# Patient Record
Sex: Male | Born: 2000
Health system: Southern US, Community
[De-identification: ages and names within clinical notes are randomized; demographics above are authoritative.]

## PROBLEM LIST (undated history)

## (undated) DIAGNOSIS — M419 Scoliosis, unspecified: Secondary | ICD-10-CM

## (undated) DIAGNOSIS — A749 Chlamydial infection, unspecified: Secondary | ICD-10-CM

---

## 2018-02-16 ENCOUNTER — Emergency Department (HOSPITAL_COMMUNITY): Payer: No Typology Code available for payment source

## 2018-02-16 ENCOUNTER — Emergency Department (HOSPITAL_COMMUNITY)
Admission: EM | Admit: 2018-02-16 | Discharge: 2018-02-16 | Disposition: A | Discharge status: Home / Self Care | Payer: No Typology Code available for payment source | Attending: Emergency Medicine | Admitting: Emergency Medicine

## 2018-02-16 ENCOUNTER — Encounter (HOSPITAL_COMMUNITY): Payer: Self-pay | Admitting: Emergency Medicine

## 2018-02-16 ENCOUNTER — Other Ambulatory Visit: Payer: Self-pay

## 2018-02-16 DIAGNOSIS — Y939 Activity, unspecified: Secondary | ICD-10-CM | POA: Insufficient documentation

## 2018-02-16 DIAGNOSIS — S39012A Strain of muscle, fascia and tendon of lower back, initial encounter: Secondary | ICD-10-CM | POA: Diagnosis not present

## 2018-02-16 DIAGNOSIS — Y999 Unspecified external cause status: Secondary | ICD-10-CM | POA: Diagnosis not present

## 2018-02-16 DIAGNOSIS — F17210 Nicotine dependence, cigarettes, uncomplicated: Secondary | ICD-10-CM | POA: Insufficient documentation

## 2018-02-16 DIAGNOSIS — Y929 Unspecified place or not applicable: Secondary | ICD-10-CM | POA: Diagnosis not present

## 2018-02-16 DIAGNOSIS — M545 Low back pain: Secondary | ICD-10-CM | POA: Diagnosis present

## 2018-02-16 HISTORY — DX: Scoliosis, unspecified: M41.9

## 2018-02-16 LAB — ETHANOL

## 2018-02-16 LAB — I-STAT CHEM 8, ED
BUN: 11 mg/dL (ref 6–20)
CHLORIDE: 105 mmol/L (ref 98–111)
Calcium, Ion: 1.2 mmol/L (ref 1.15–1.40)
Creatinine, Ser: 0.7 mg/dL (ref 0.61–1.24)
Glucose, Bld: 86 mg/dL (ref 70–99)
HCT: 39 % (ref 39.0–52.0)
Hemoglobin: 13.3 g/dL (ref 13.0–17.0)
Potassium: 4 mmol/L (ref 3.5–5.1)
Sodium: 139 mmol/L (ref 135–145)
TCO2: 29 mmol/L (ref 22–32)

## 2018-02-16 MED ORDER — SODIUM CHLORIDE 0.9 % IV BOLUS
500.0000 mL | Freq: Once | INTRAVENOUS | Status: AC
  Administered 2018-02-16: 500 mL via INTRAVENOUS

## 2018-02-16 MED ORDER — IBUPROFEN 800 MG PO TABS
800.0000 mg | ORAL_TABLET | Freq: Once | ORAL | Status: AC
  Administered 2018-02-16: 800 mg via ORAL
  Filled 2018-02-16: qty 1

## 2018-02-16 MED ORDER — IOHEXOL 300 MG/ML  SOLN
100.0000 mL | Freq: Once | INTRAMUSCULAR | Status: AC | PRN
  Administered 2018-02-16: 100 mL via INTRAVENOUS

## 2018-02-16 MED ORDER — IBUPROFEN 800 MG PO TABS: 800.0000 mg | ORAL_TABLET | Freq: Three times a day (TID) | ORAL | 0 refills | Status: DC | PRN

## 2018-02-16 NOTE — ED Notes (Signed)
Pt verbalized understanding of d/c instructions and has no further questions, VSS, NAD.  
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
## 2018-02-16 NOTE — Discharge Instructions (Addendum)
Follow-up with your doctor if any problems 

## 2018-12-10 ENCOUNTER — Other Ambulatory Visit: Payer: Self-pay

## 2018-12-10 ENCOUNTER — Emergency Department (HOSPITAL_COMMUNITY): Payer: Self-pay

## 2018-12-10 ENCOUNTER — Emergency Department (HOSPITAL_COMMUNITY)
Admission: EM | Admit: 2018-12-10 | Discharge: 2018-12-10 | Disposition: A | Discharge status: Home / Self Care | Payer: Self-pay | Attending: Emergency Medicine | Admitting: Emergency Medicine

## 2018-12-10 ENCOUNTER — Encounter (HOSPITAL_COMMUNITY): Payer: Self-pay | Admitting: Emergency Medicine

## 2018-12-10 DIAGNOSIS — F17210 Nicotine dependence, cigarettes, uncomplicated: Secondary | ICD-10-CM | POA: Insufficient documentation

## 2018-12-10 DIAGNOSIS — R0789 Other chest pain: Secondary | ICD-10-CM | POA: Insufficient documentation

## 2018-12-10 DIAGNOSIS — F419 Anxiety disorder, unspecified: Secondary | ICD-10-CM | POA: Insufficient documentation

## 2018-12-10 LAB — CBC
HCT: 38.1 % — ABNORMAL LOW (ref 39.0–52.0)
Hemoglobin: 13.5 g/dL (ref 13.0–17.0)
MCH: 30.8 pg (ref 26.0–34.0)
MCHC: 35.4 g/dL (ref 30.0–36.0)
MCV: 86.8 fL (ref 80.0–100.0)
Platelets: 279 10*3/uL (ref 150–400)
RBC: 4.39 MIL/uL (ref 4.22–5.81)
RDW: 12.8 % (ref 11.5–15.5)
WBC: 7.6 10*3/uL (ref 4.0–10.5)
nRBC: 0 % (ref 0.0–0.2)

## 2018-12-10 LAB — TROPONIN I (HIGH SENSITIVITY)
Troponin I (High Sensitivity): 3 ng/L (ref <18)
Troponin I (High Sensitivity): 4 ng/L (ref <18)

## 2018-12-10 LAB — BASIC METABOLIC PANEL
Anion gap: 11 (ref 5–15)
BUN: 11 mg/dL (ref 6–20)
CO2: 22 mmol/L (ref 22–32)
Calcium: 9.9 mg/dL (ref 8.9–10.3)
Chloride: 103 mmol/L (ref 98–111)
Creatinine, Ser: 0.82 mg/dL (ref 0.61–1.24)
GFR calc Af Amer: >60 mL/min (ref >60)
GFR calc non Af Amer: >60 mL/min (ref >60)
Glucose, Bld: 85 mg/dL (ref 70–99)
Potassium: 3.5 mmol/L (ref 3.5–5.1)
Sodium: 136 mmol/L (ref 135–145)

## 2018-12-10 MED ORDER — SODIUM CHLORIDE 0.9% FLUSH: 3.0000 mL | Freq: Once | INTRAVENOUS | Status: DC

## 2018-12-10 MED ORDER — LORAZEPAM 1 MG PO TABS
1.0000 mg | ORAL_TABLET | Freq: Once | ORAL | Status: AC
  Administered 2018-12-10: 1 mg via ORAL
  Filled 2018-12-10: qty 1

## 2018-12-10 NOTE — ED Notes (Signed)
1 mg PO verbal order given by PA Upstill

## 2018-12-10 NOTE — ED Provider Notes (Signed)
[facility] EMERGENCY DEPARTMENT Provider Note   CSN: [id number] Arrival date & time: 12/10/18  0150     History   Chief Complaint Chief Complaint  Patient presents with  . Shortness of Breath  . Chest Pain    HPI Richard [name] is a 18 y.o. male.     Patient who is otherwise healthy presents to the ED after waking tonight while at home in bed asleep. He reports having a normal day yesterday, normal diet, no recent or current illness, no current stressful events. When he woke he states he felt "weird", then felt short of breath and had chest tightness. His symptoms lasted about an hour before then started to subside. No history of similar symptoms. He denies drug or alcohol use.   The history is provided by the patient. No language interpreter was used.  Shortness of Breath Associated symptoms: chest pain   Associated symptoms: no cough, no fever, no vomiting and no wheezing   Chest Pain Associated symptoms: shortness of breath   Associated symptoms: no cough, no fever, no nausea and no vomiting     Past Medical History:  Diagnosis Date  . Scoliosis     There are no active problems to display for this patient.   History reviewed. No pertinent surgical history.      Home Medications    Prior to Admission medications   Medication Sig Start Date End Date Taking? Authorizing Provider  ibuprofen (ADVIL,MOTRIN) 800 MG tablet Take 1 tablet (800 mg total) by mouth every 8 (eight) hours as needed for moderate pain. 02/16/18   Milton Ferguson, MD    Family History History reviewed. No pertinent family history.  Social History Social History   Tobacco Use  . Smoking status: Current Every Day Smoker    Packs/day: 0.50    Types: Cigarettes  . Smokeless tobacco: Never Used  Substance Use Topics  . Alcohol use: Never    Frequency: Never  . Drug use: Never     Allergies   Patient has no known allergies.   Review of Systems Review of Systems   Constitutional: Negative for chills and fever.  HENT: Negative.  Negative for congestion.   Respiratory: Positive for shortness of breath. Negative for cough and wheezing.   Cardiovascular: Positive for chest pain.  Gastrointestinal: Negative.  Negative for nausea and vomiting.  Musculoskeletal: Negative.   Skin: Negative.   Neurological: Negative.   Psychiatric/Behavioral: The patient is nervous/anxious.      Physical Exam Updated Vital Signs Ht 5\' 6"  (1.676 m)   Wt 58.1 kg   BMI 20.66 kg/m   Physical Exam Vitals signs and nursing note reviewed.  Constitutional:      General: He is not in acute distress.    Appearance: He is well-developed.  HENT:     Head: Normocephalic.  Neck:     Musculoskeletal: Normal range of motion and neck supple.  Cardiovascular:     Rate and Rhythm: Normal rate and regular rhythm.  Pulmonary:     Effort: Pulmonary effort is normal.     Breath sounds: Normal breath sounds. No wheezing, rhonchi or rales.  Chest:     Chest wall: No tenderness.  Abdominal:     General: Bowel sounds are normal.     Palpations: Abdomen is soft.     Tenderness: There is no abdominal tenderness. There is no guarding or rebound.  Musculoskeletal: Normal range of motion.  Skin:    General: Skin is  warm and dry.     Findings: No rash.  Neurological:     Mental Status: He is alert and oriented to person, place, and time.  Psychiatric:        Mood and Affect: Mood is anxious.        Thought Content: Thought content is not paranoid or delusional.      ED Treatments / Results  Labs (all labs ordered are listed, but only abnormal results are displayed) Labs Reviewed  CBC - Abnormal; Notable for the following components:      Result Value   HCT 38.1 (*)    All other components within normal limits  BASIC METABOLIC PANEL  TROPONIN I (HIGH SENSITIVITY)    EKG EKG Interpretation  Date/Time:  Wednesday December 10 2018 01:58:06 EDT Ventricular Rate:  73 PR  Interval:    QRS Duration: 93 QT Interval:  366 QTC Calculation: 404 R Axis:   83 Text Interpretation:  Sinus rhythm ST elev, probable normal early repol pattern When compared with ECG of 02/16/2018, No significant change was found Confirmed by Dione Booze ([id number]) on 12/10/2018 2:02:05 AM   Radiology No results found.  Procedures Procedures (including critical care time)  Medications Ordered in ED Medications  sodium chloride flush (NS) 0.9 % injection 3 mL (has no administration in time range)     Initial Impression / Assessment and Plan / ED Course  I have reviewed the triage vital signs and the nursing notes.  Pertinent labs & imaging results that were available during my care of the patient were reviewed by me and considered in my medical decision making (see chart for details).        Patient to ED after waking tonight from sleep with symptoms of SOB, chest tightness which lasted about an hour.   The patient is anxious appearing but not acutely ill. Labs, EKG, CXR are all normal. He is sleeping on re-evaluation.   Symptoms likely related to anxiety/panic. He is reassured and felt able to be discharged home.   Final Clinical Impressions(s) / ED Diagnoses   Final diagnoses:  None   1. anxiety  ED Discharge Orders    None       Danne Harbor 12/10/18 0403    Dione Booze, MD 12/10/18 [phone]
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
## 2018-12-10 NOTE — Discharge Instructions (Addendum)
Follow up with [facility] if symptoms recur.

## 2019-03-18 ENCOUNTER — Ambulatory Visit (HOSPITAL_COMMUNITY)
Admission: EM | Admit: 2019-03-18 | Discharge: 2019-03-18 | Disposition: A | Discharge status: Home / Self Care | Payer: Self-pay | Attending: Family Medicine | Admitting: Family Medicine

## 2019-03-18 ENCOUNTER — Other Ambulatory Visit: Payer: Self-pay

## 2019-03-18 ENCOUNTER — Encounter (HOSPITAL_COMMUNITY): Payer: Self-pay

## 2019-03-18 ENCOUNTER — Ambulatory Visit (INDEPENDENT_AMBULATORY_CARE_PROVIDER_SITE_OTHER): Payer: Self-pay

## 2019-03-18 ENCOUNTER — Encounter (HOSPITAL_COMMUNITY): Payer: Self-pay | Admitting: Emergency Medicine

## 2019-03-18 DIAGNOSIS — Z20822 Contact with and (suspected) exposure to covid-19: Secondary | ICD-10-CM

## 2019-03-18 DIAGNOSIS — R0602 Shortness of breath: Secondary | ICD-10-CM

## 2019-03-18 DIAGNOSIS — R110 Nausea: Secondary | ICD-10-CM

## 2019-03-18 DIAGNOSIS — M6281 Muscle weakness (generalized): Secondary | ICD-10-CM | POA: Insufficient documentation

## 2019-03-18 DIAGNOSIS — R531 Weakness: Secondary | ICD-10-CM

## 2019-03-18 DIAGNOSIS — B349 Viral infection, unspecified: Secondary | ICD-10-CM

## 2019-03-18 DIAGNOSIS — F17210 Nicotine dependence, cigarettes, uncomplicated: Secondary | ICD-10-CM | POA: Insufficient documentation

## 2019-03-18 LAB — BASIC METABOLIC PANEL
Anion gap: 10 (ref 5–15)
BUN: 10 mg/dL (ref 6–20)
CO2: 26 mmol/L (ref 22–32)
Calcium: 9.9 mg/dL (ref 8.9–10.3)
Chloride: 102 mmol/L (ref 98–111)
Creatinine, Ser: 0.79 mg/dL (ref 0.61–1.24)
GFR calc Af Amer: >60 mL/min (ref >60)
GFR calc non Af Amer: >60 mL/min (ref >60)
Glucose, Bld: 88 mg/dL (ref 70–99)
Potassium: 3.7 mmol/L (ref 3.5–5.1)
Sodium: 138 mmol/L (ref 135–145)

## 2019-03-18 LAB — CBC
HCT: 38.8 % — ABNORMAL LOW (ref 39.0–52.0)
Hemoglobin: 13.8 g/dL (ref 13.0–17.0)
MCH: 31.3 pg (ref 26.0–34.0)
MCHC: 35.6 g/dL (ref 30.0–36.0)
MCV: 88 fL (ref 80.0–100.0)
Platelets: 254 10*3/uL (ref 150–400)
RBC: 4.41 MIL/uL (ref 4.22–5.81)
RDW: 13.6 % (ref 11.5–15.5)
WBC: 7.6 10*3/uL (ref 4.0–10.5)
nRBC: 0 % (ref 0.0–0.2)

## 2019-03-18 LAB — CBG MONITORING, ED
Glucose-Capillary: 70 mg/dL (ref 70–99)
Glucose-Capillary: 70 mg/dL (ref 70–99)
Glucose-Capillary: 165 mg/dL — ABNORMAL HIGH (ref 70–99)

## 2019-03-18 LAB — GLUCOSE, CAPILLARY: Glucose-Capillary: 70 mg/dL (ref 70–99)

## 2019-03-18 MED ORDER — ONDANSETRON 4 MG PO TBDP: 4.0000 mg | ORAL_TABLET | Freq: Three times a day (TID) | ORAL | 0 refills | Status: DC | PRN

## 2019-03-18 MED ORDER — IBUPROFEN 800 MG PO TABS: 800.0000 mg | ORAL_TABLET | Freq: Three times a day (TID) | ORAL | 0 refills | Status: DC
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
## 2019-03-18 NOTE — ED Provider Notes (Signed)
[facility]   [id number] 03/18/19 Arrival Time: 7408  ASSESSMENT & PLAN:  1. Acute viral syndrome   2. Weakness   3. Nausea without vomiting   4. Exposure to COVID-19 virus     I have tried to reassure Richard [name] that I do not suspect any dangerous or life-threatening medical problems given his current symptoms. He is still very nervous and anxious. Benign exam.  See AVS for discharge instructions. Awaiting COVID testing. Work note given with self-isolation instructions.  I have personally viewed the imaging studies ordered this visit. No pneumonia on CXR. CBG WNL.  Meds ordered this encounter  Medications  . ondansetron (ZOFRAN-ODT) 4 MG disintegrating tablet    Sig: Take 1 tablet (4 mg total) by mouth every 8 (eight) hours as needed for nausea or vomiting.    Dispense:  15 tablet    Refill:  0  . ibuprofen (ADVIL) 800 MG tablet    Sig: Take 1 tablet (800 mg total) by mouth 3 (three) times daily with meals.    Dispense:  21 tablet    Refill:  0    Discussed typical duration of symptoms of viral illnesses. Ensure adequate fluid intake and rest. May f/u with PCP or here as needed.   Follow-up Information    [facility].   Specialty: Urgent Care Why: If worsening or failing to improve as anticipated. Contact information: [address] [place] [address] [phone]          Reviewed expectations re: course of current medical issues. Questions answered. Outlined signs and symptoms indicating need for more acute intervention. Patient verbalized understanding. After Visit Summary given.   SUBJECTIVE: History from: patient.  Richard [name] is a 18 y.o. male who presents with complaint of nausea without emesis, fatigue, weakness, occasional and transient SOB without CP, decreased appetite, and muscle 'tremors'. Also reports that he hasn't been able to get an erection as usual since symptoms started. Does have  nocturnal erections; 'just not right during the day'. Onset of current symptoms abrupt, approx 48 hours ago. No current SOB or wheezing. Fever: absent. Overall decreased PO intake; decreased appetite. No abdominal or back pain reported. Known sick contacts or COVID-19 exposure: yes; recently watched movie with a girl who has tested positive. No specific or significant aggravating or alleviating factors reported. He was tested for COVID yesterday; awaiting results. OTC treatment: none reported.  Received flu shot this year: no.  Social History   Tobacco Use  Smoking Status Current Every Day Smoker  . Packs/day: 0.20  . Types: Cigarettes  Smokeless Tobacco Never Used  Tobacco Comment   2 cigs per day    ROS: As per HPI. All other systems negative.    OBJECTIVE:  Vitals:   03/18/19 1024  BP: (!) 136/99  Pulse: 68  Resp: 16  Temp: 98.7 F (37.1 C)  TempSrc: Oral  SpO2: 98%     General appearance: alert; very anxious over current symptoms; muscular; appears healthy HEENT: sounds nasally congested; no nasal drainage Neck: supple without LAD CV: RRR Lungs: unlabored respirations, symmetrical air entry without wheezing; cough: absent; speaks full sentences without any difficulty Abd: soft Ext: no LE edema Back: normal Skin: warm and dry Psychological: alert and cooperative  Imaging: DG Chest 2 View  Result Date: 03/18/2019 CLINICAL DATA:  Shortness of breath EXAM: CHEST - 2 VIEW COMPARISON:  December 10, 2018 FINDINGS: Lungs are clear. Heart size and pulmonary vascularity are  normal. No adenopathy. There is again noted midthoracic dextroscoliosis. IMPRESSION: Scoliosis.  Lungs clear.  Cardiac silhouette normal.  No adenopathy. Electronically Signed   By: Lowella Grip III M.D.   On: 03/18/2019 11:04    No Known Allergies  Past Medical History:  Diagnosis Date  . Scoliosis    Family History  Problem Relation Age of Onset  . Sickle cell anemia Mother   . Healthy  Father    Social History   Socioeconomic History  . Marital status: Single    Spouse name: Not on file  . Number of children: Not on file  . Years of education: Not on file  . Highest education level: Not on file  Occupational History  . Not on file  Tobacco Use  . Smoking status: Current Every Day Smoker    Packs/day: 0.20    Types: Cigarettes  . Smokeless tobacco: Never Used  . Tobacco comment: 2 cigs per day  Substance and Sexual Activity  . Alcohol use: Never  . Drug use: Never  . Sexual activity: Not on file  Other Topics Concern  . Not on file  Social History Narrative  . Not on file   Social Determinants of Health   Financial Resource Strain:   . Difficulty of Paying Living Expenses: Not on file  Food Insecurity:   . Worried About Charity fundraiser in the Last Year: Not on file  . Ran Out of Food in the Last Year: Not on file  Transportation Needs:   . Lack of Transportation (Medical): Not on file  . Lack of Transportation (Non-Medical): Not on file  Physical Activity:   . Days of Exercise per Week: Not on file  . Minutes of Exercise per Session: Not on file  Stress:   . Feeling of Stress : Not on file  Social Connections:   . Frequency of Communication with Friends and Family: Not on file  . Frequency of Social Gatherings with Friends and Family: Not on file  . Attends Religious Services: Not on file  . Active Member of Clubs or Organizations: Not on file  . Attends Archivist Meetings: Not on file  . Marital Status: Not on file  Intimate Partner Violence:   . Fear of Current or Ex-Partner: Not on file  . Emotionally Abused: Not on file  . Physically Abused: Not on file  . Sexually Abused: Not on file           Vanessa Kick, MD 03/18/19 1206

## 2019-03-18 NOTE — Discharge Instructions (Signed)
Caring for yourself: Get plenty of rest. Drink plenty of fluids, enough so that your urine is light yellow or clear like water. If you have kidney, heart, or liver disease and have to limit fluids, talk with your doctor before you increase the amount of fluids you drink. Take an over-the-counter pain medicine if needed, such as acetaminophen (Tylenol), ibuprofen (Advil, Motrin), or naproxen (Aleve), to relieve fever, headache, and muscle aches. Read and follow all instructions on the label. No one younger than 20 should take aspirin. It has been linked to Reye syndrome, a serious illness. Before you use over the counter cough and cold medicines, check the label. These medicines may not be safe for children younger than age 6 or for people with certain health problems. If the skin around your nose and lips becomes sore, put some petroleum jelly on the area.  Avoid spreading a virus: Wash your hands regularly, and keep your hands away from your face.  Stay home from school, work, and other public places until you are feeling better and your fever has been gone for at least 24 hours. The fever needs to have gone away on its own without the help of medicine.  

## 2019-03-18 NOTE — ED Triage Notes (Signed)
Patient presents to Urgent Care with complaints of nausea, muscle weakness, erection problems, shortness of breath, loss of appetite, and tremors since two days ago. Patient reports he has had contact w/ covid positive people, had a covid test yesterday at [facility] which is pending.

## 2019-03-18 NOTE — ED Notes (Signed)
Pt states unable to void at this time, will monitor.

## 2019-03-19 ENCOUNTER — Emergency Department (HOSPITAL_COMMUNITY)
Admission: EM | Admit: 2019-03-19 | Discharge: 2019-03-19 | Disposition: A | Discharge status: Home / Self Care | Payer: Self-pay | Attending: Emergency Medicine | Admitting: Emergency Medicine

## 2019-03-19 DIAGNOSIS — R531 Weakness: Secondary | ICD-10-CM

## 2019-03-19 LAB — RAPID URINE DRUG SCREEN, HOSP PERFORMED
Amphetamines: NOT DETECTED
Barbiturates: NOT DETECTED
Benzodiazepines: NOT DETECTED
Cocaine: NOT DETECTED
Opiates: NOT DETECTED
Tetrahydrocannabinol: NOT DETECTED

## 2019-03-19 LAB — URINALYSIS, ROUTINE W REFLEX MICROSCOPIC
Bilirubin Urine: NEGATIVE
Glucose, UA: NEGATIVE mg/dL
Hgb urine dipstick: NEGATIVE
Ketones, ur: NEGATIVE mg/dL
Leukocytes,Ua: NEGATIVE
Nitrite: NEGATIVE
Protein, ur: NEGATIVE mg/dL
Specific Gravity, Urine: 1.018 (ref 1.005–1.030)
pH: 5 (ref 5.0–8.0)

## 2019-03-19 MED ORDER — SODIUM CHLORIDE 0.9 % IV BOLUS
500.0000 mL | Freq: Once | INTRAVENOUS | Status: AC
  Administered 2019-03-19: 500 mL via INTRAVENOUS

## 2019-03-19 NOTE — Discharge Instructions (Addendum)
Get plenty of rest, drink lots of fluid. Take Tylenol as needed for any fever that may develop or discomfort.   A referral has been provided for you to become established with an primary care provided, which is recommended.

## 2019-03-19 NOTE — ED Provider Notes (Signed)
[facility] DEPT Provider Note   CSN: [id number] Arrival date & time: 03/18/19  1651     History Chief Complaint  Patient presents with  . Tingling  . Weakness    Richard [name] is a 18 y.o. male.  Patient to ED with complaint of generalized weakness, fatigue, nausea, constipation for 4 days. No cough, fever, vomiting. 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 He has a COVID test pending but has not been given results yet. He feels weak when exerting - walking any distance. No lightheadedness, difficulty breathing. He feels his heart "skips a beat" periodically and his extremities have a tingling sensation intermittently. Seen at [facility] yesterday for same symptoms.   The history is provided by the patient. No language interpreter was used.  Weakness Associated symptoms: nausea and shortness of breath   Associated symptoms: no abdominal pain, no chest pain, no cough, no fever, no myalgias and no vomiting        Past Medical History:  Diagnosis Date  . Scoliosis     There are no problems to display for this patient.   History reviewed. No pertinent surgical history.     Family History  Problem Relation Age of Onset  . Sickle cell anemia Mother   . Healthy Father     Social History   Tobacco Use  . Smoking status: Current Every Day Smoker    Packs/day: 0.20    Types: Cigarettes  . Smokeless tobacco: Never Used  . Tobacco comment: 2 cigs per day  Substance Use Topics  . Alcohol use: Never  . Drug use: Never    Home Medications Prior to Admission medications   Medication Sig Start Date End Date Taking? Authorizing Provider  ibuprofen (ADVIL) 800 MG tablet Take 1 tablet (800 mg total) by mouth 3 (three) times daily with meals. 03/18/19   Vanessa Kick, MD  ondansetron (ZOFRAN-ODT) 4 MG disintegrating tablet Take 1 tablet (4 mg total) by mouth every 8 (eight) hours as needed for nausea or  vomiting. 03/18/19   Vanessa Kick, MD    Allergies    Patient has no known allergies.  Review of Systems   Review of Systems  Constitutional: Positive for activity change and fatigue. Negative for chills and fever.  HENT: Negative.  Negative for congestion and sore throat.   Respiratory: Positive for shortness of breath. Negative for cough.   Cardiovascular: Negative.  Negative for chest pain.  Gastrointestinal: Positive for constipation and nausea. Negative for abdominal pain and vomiting.  Musculoskeletal: Negative.  Negative for myalgias.  Skin: Negative.  Negative for rash.  Neurological: Positive for weakness. Negative for syncope and light-headedness.    Physical Exam Updated Vital Signs BP 122/89 (BP Location: Left Arm)   Pulse 65   Temp 99.3 F (37.4 C) (Oral)   Resp 16   SpO2 100%   Physical Exam Vitals and nursing note reviewed.  Constitutional:      Appearance: He is well-developed.  HENT:     Head: Normocephalic.  Eyes:     Conjunctiva/sclera: Conjunctivae normal.  Cardiovascular:     Rate and Rhythm: Normal rate and regular rhythm.     Heart sounds: No murmur.  Pulmonary:     Effort: Pulmonary effort is normal.     Breath sounds: Normal breath sounds. No wheezing, rhonchi or rales.  Abdominal:     General: Bowel sounds are normal.  Palpations: Abdomen is soft.     Tenderness: There is no abdominal tenderness. There is no guarding or rebound.  Musculoskeletal:        General: Normal range of motion.     Cervical back: Normal range of motion and neck supple.  Skin:    General: Skin is warm and dry.     Findings: No rash.  Neurological:     Mental Status: He is alert and oriented to person, place, and time.     ED Results / Procedures / Treatments   Labs (all labs ordered are listed, but only abnormal results are displayed) Labs Reviewed  CBC - Abnormal; Notable for the following components:      Result Value   HCT 38.8 (*)    All other  components within normal limits  CBG MONITORING, ED - Abnormal; Notable for the following components:   Glucose-Capillary 165 (*)    All other components within normal limits  BASIC METABOLIC PANEL  URINALYSIS, ROUTINE W REFLEX MICROSCOPIC    EKG None  Radiology DG Chest 2 View  Result Date: 03/18/2019 CLINICAL DATA:  Shortness of breath EXAM: CHEST - 2 VIEW COMPARISON:  December 10, 2018 FINDINGS: Lungs are clear. Heart size and pulmonary vascularity are normal. No adenopathy. There is again noted midthoracic dextroscoliosis. IMPRESSION: Scoliosis.  Lungs clear.  Cardiac silhouette normal.  No adenopathy. Electronically Signed   By: Bretta Bang III M.D.   On: 03/18/2019 11:04    Procedures Procedures (including critical care time)  Medications Ordered in ED Medications - No data to display  ED Course  I have reviewed the triage vital signs and the nursing notes.  Pertinent labs & imaging results that were available during my care of the patient were reviewed by me and considered in my medical decision making (see chart for details).    MDM Rules/Calculators/A&P                      Patient to ED with symptoms as described in HPI.   He is well appearing. Non-toxic, normal VS. Chart review shows normal CXR from previous evaluation at Urgent Care. Labs here (CBC, BMET) without abnormality. He has so far refused to provide urine.   Will given small IVF bolus. Will wait for UA. Will add UDS.   UA, UDS negative. VSS. He is sleeping on re-evaluation. Well appearing. Unclear as to what is the cause of symptoms, however, does not appear to be a bacterial infection, sepsis, metabolic abnormality. He has received fluids and reports feeling some better. COVID pending through the [facility].   He can be discharged home. Encouraged to establish with primary care provider. Referral provided.   Final Clinical Impression(s) / ED Diagnoses Final diagnoses:  None   1.  Generalized weakness  Rx / DC Orders ED Discharge Orders    None       Elpidio Anis, PA-C 03/19/19 0304    Mesner, Barbara Cower, MD 03/19/19 [phone]

## 2019-03-19 NOTE — ED Notes (Signed)
Pt given urinal and made aware of need for urine specimen 

## 2019-03-20 ENCOUNTER — Emergency Department (HOSPITAL_COMMUNITY): Payer: Self-pay

## 2019-03-20 ENCOUNTER — Observation Stay (HOSPITAL_COMMUNITY): Payer: Self-pay

## 2019-03-20 ENCOUNTER — Encounter (HOSPITAL_COMMUNITY): Payer: Self-pay | Admitting: Primary Care

## 2019-03-20 ENCOUNTER — Inpatient Hospital Stay (HOSPITAL_COMMUNITY)
Admission: EM | Admit: 2019-03-20 | Discharge: 2019-03-22 | DRG: 101 | Disposition: A | Discharge status: Home / Self Care | Payer: Self-pay | Attending: Internal Medicine | Admitting: Internal Medicine

## 2019-03-20 DIAGNOSIS — N529 Male erectile dysfunction, unspecified: Secondary | ICD-10-CM | POA: Diagnosis present

## 2019-03-20 DIAGNOSIS — Z20822 Contact with and (suspected) exposure to covid-19: Secondary | ICD-10-CM | POA: Diagnosis present

## 2019-03-20 DIAGNOSIS — R001 Bradycardia, unspecified: Secondary | ICD-10-CM | POA: Diagnosis present

## 2019-03-20 DIAGNOSIS — Z832 Family history of diseases of the blood and blood-forming organs and certain disorders involving the immune mechanism: Secondary | ICD-10-CM

## 2019-03-20 DIAGNOSIS — M419 Scoliosis, unspecified: Secondary | ICD-10-CM | POA: Diagnosis present

## 2019-03-20 DIAGNOSIS — F1290 Cannabis use, unspecified, uncomplicated: Secondary | ICD-10-CM | POA: Diagnosis present

## 2019-03-20 DIAGNOSIS — K5900 Constipation, unspecified: Secondary | ICD-10-CM | POA: Diagnosis present

## 2019-03-20 DIAGNOSIS — Y990 Civilian activity done for income or pay: Secondary | ICD-10-CM

## 2019-03-20 DIAGNOSIS — R569 Unspecified convulsions: Principal | ICD-10-CM

## 2019-03-20 DIAGNOSIS — E860 Dehydration: Secondary | ICD-10-CM | POA: Diagnosis present

## 2019-03-20 DIAGNOSIS — F17210 Nicotine dependence, cigarettes, uncomplicated: Secondary | ICD-10-CM | POA: Diagnosis present

## 2019-03-20 DIAGNOSIS — F419 Anxiety disorder, unspecified: Secondary | ICD-10-CM

## 2019-03-20 DIAGNOSIS — Z823 Family history of stroke: Secondary | ICD-10-CM

## 2019-03-20 DIAGNOSIS — Y9289 Other specified places as the place of occurrence of the external cause: Secondary | ICD-10-CM

## 2019-03-20 DIAGNOSIS — F410 Panic disorder [episodic paroxysmal anxiety] without agoraphobia: Secondary | ICD-10-CM | POA: Diagnosis present

## 2019-03-20 DIAGNOSIS — W1830XA Fall on same level, unspecified, initial encounter: Secondary | ICD-10-CM | POA: Diagnosis present

## 2019-03-20 DIAGNOSIS — D573 Sickle-cell trait: Secondary | ICD-10-CM | POA: Diagnosis present

## 2019-03-20 HISTORY — DX: Unspecified convulsions: R56.9

## 2019-03-20 LAB — CK: Total CK: 752 U/L — ABNORMAL HIGH (ref 49–397)

## 2019-03-20 LAB — CSF CELL COUNT WITH DIFFERENTIAL
RBC Count, CSF: 11 /mm3 — ABNORMAL HIGH
RBC Count, CSF: 2 /mm3 — ABNORMAL HIGH
Tube #: 4
Tube #: 1
WBC, CSF: 0 /mm3 (ref 0–5)
WBC, CSF: 0 /mm3 (ref 0–5)

## 2019-03-20 LAB — CBC WITH DIFFERENTIAL/PLATELET
Abs Immature Granulocytes: 0.01 10*3/uL (ref 0.00–0.07)
Basophils Absolute: 0 10*3/uL (ref 0.0–0.1)
Basophils Relative: 1 %
Eosinophils Absolute: 0 10*3/uL (ref 0.0–0.5)
Eosinophils Relative: 1 %
HCT: 38.4 % — ABNORMAL LOW (ref 39.0–52.0)
Hemoglobin: 13.4 g/dL (ref 13.0–17.0)
Immature Granulocytes: 0 %
Lymphocytes Relative: 18 %
Lymphs Abs: 1.1 10*3/uL (ref 0.7–4.0)
MCH: 31.1 pg (ref 26.0–34.0)
MCHC: 34.9 g/dL (ref 30.0–36.0)
MCV: 89.1 fL (ref 80.0–100.0)
Monocytes Absolute: 0.5 10*3/uL (ref 0.1–1.0)
Monocytes Relative: 8 %
Neutro Abs: 4.2 10*3/uL (ref 1.7–7.7)
Neutrophils Relative %: 72 %
Platelets: 284 10*3/uL (ref 150–400)
RBC: 4.31 MIL/uL (ref 4.22–5.81)
RDW: 13.6 % (ref 11.5–15.5)
WBC: 5.8 10*3/uL (ref 4.0–10.5)
nRBC: 0 % (ref 0.0–0.2)

## 2019-03-20 LAB — COMPREHENSIVE METABOLIC PANEL
ALT: 40 U/L (ref 0–44)
AST: 28 U/L (ref 15–41)
Albumin: 4.6 g/dL (ref 3.5–5.0)
Alkaline Phosphatase: 61 U/L (ref 38–126)
Anion gap: 12 (ref 5–15)
BUN: 8 mg/dL (ref 6–20)
CO2: 24 mmol/L (ref 22–32)
Calcium: 9.9 mg/dL (ref 8.9–10.3)
Chloride: 103 mmol/L (ref 98–111)
Creatinine, Ser: 0.96 mg/dL (ref 0.61–1.24)
GFR calc Af Amer: >60 mL/min (ref >60)
GFR calc non Af Amer: >60 mL/min (ref >60)
Glucose, Bld: 89 mg/dL (ref 70–99)
Potassium: 4.2 mmol/L (ref 3.5–5.1)
Sodium: 139 mmol/L (ref 135–145)
Total Bilirubin: 1.5 mg/dL — ABNORMAL HIGH (ref 0.3–1.2)
Total Protein: 7.4 g/dL (ref 6.5–8.1)

## 2019-03-20 LAB — RESPIRATORY PANEL BY RT PCR (FLU A&B, COVID)
Influenza A by PCR: NEGATIVE
Influenza B by PCR: NEGATIVE
SARS Coronavirus 2 by RT PCR: NEGATIVE

## 2019-03-20 LAB — SEDIMENTATION RATE: Sed Rate: 2 mm/hr (ref 0–16)

## 2019-03-20 LAB — ETHANOL: Alcohol, Ethyl (B): <10 mg/dL (ref <10)

## 2019-03-20 LAB — CRYPTOCOCCAL ANTIGEN, CSF: Crypto Ag: NEGATIVE

## 2019-03-20 LAB — GLUCOSE, CSF: Glucose, CSF: 55 mg/dL (ref 40–70)

## 2019-03-20 LAB — PROTEIN, CSF: Total  Protein, CSF: 21 mg/dL (ref 15–45)

## 2019-03-20 LAB — SARS CORONAVIRUS 2 (TAT 6-24 HRS): SARS Coronavirus 2: NEGATIVE

## 2019-03-20 LAB — TSH: TSH: 0.548 u[IU]/mL (ref 0.350–4.500)

## 2019-03-20 MED ORDER — ONDANSETRON HCL 4 MG/2ML IJ SOLN: 4.0000 mg | Freq: Four times a day (QID) | INTRAMUSCULAR | Status: DC | PRN

## 2019-03-20 MED ORDER — LORAZEPAM 2 MG/ML IJ SOLN
INTRAMUSCULAR | Status: AC
  Administered 2019-03-20: 10:00:00 2 mg
  Filled 2019-03-20: qty 1

## 2019-03-20 MED ORDER — SENNOSIDES-DOCUSATE SODIUM 8.6-50 MG PO TABS
2.0000 | ORAL_TABLET | Freq: Two times a day (BID) | ORAL | Status: DC
  Administered 2019-03-20 – 2019-03-21 (×2): 2 via ORAL
  Filled 2019-03-20 (×4): qty 2

## 2019-03-20 MED ORDER — LORAZEPAM 2 MG/ML IJ SOLN
INTRAMUSCULAR | Status: AC
  Filled 2019-03-20: qty 1

## 2019-03-20 MED ORDER — GADOBUTROL 1 MMOL/ML IV SOLN
5.5000 mL | Freq: Once | INTRAVENOUS | Status: AC | PRN
  Administered 2019-03-20: 19:00:00 5.5 mL via INTRAVENOUS

## 2019-03-20 MED ORDER — DEXTROSE 5 % IV SOLN
10.0000 mg/kg | Freq: Three times a day (TID) | INTRAVENOUS | Status: DC
  Administered 2019-03-21 (×2): 580 mg via INTRAVENOUS
  Filled 2019-03-20 (×5): qty 11.6

## 2019-03-20 MED ORDER — LORAZEPAM 2 MG/ML IJ SOLN
2.0000 mg | Freq: Four times a day (QID) | INTRAMUSCULAR | Status: DC | PRN
  Filled 2019-03-20 (×2): qty 1

## 2019-03-20 MED ORDER — ACETAMINOPHEN 325 MG PO TABS: 650.0000 mg | ORAL_TABLET | Freq: Four times a day (QID) | ORAL | Status: DC | PRN

## 2019-03-20 NOTE — Consult Note (Addendum)
NEURO HOSPITALIST CONSULT NOTE   Requesting physician: Dr. Anitra Lauth   Reason for Consult: witnessed seizure like activity   History obtained from:  Patient / mother  HPI:                                                                                                                                          Richard [name] is an 19 y.o. male  whith PMH scoliosis and sickle cell trait who presented to [facility] ED after a witnessed seizure like episode at his place of work which is at [employer] in the [facility].  Patient was seen  And discharged yesterday from  [facility]- ED for numbess/tingling , weakness to both legs and constipation for 4 days. Patient has been having weakness of both legs for about 1 week. Today, while at work (works in [employer]) patient was in the breakroom and  Started having generalized shaking, staff caught him before he fell to the floor. Patient stated that today before the seizure like episode he felt weak, had some chest tightness. He felt like tingling shooting through his entire body and through his head. Denies any sick contact, fever, chills,n/v/d, SOB, CP. Endorses sensation of lightheaded. Denies ETOH, or drug abuse. Endorses smoking. Mother at the bedside and denies any prior seizure history including febrile seizures. Mother stated that she had sickle cell and has had a stroke and subsequent seizures.   Additional information: Possibly confided in some friend that he is having some troubles with the home front and has been homeless as he is now living with his parents anymore.  He has been picking up extra work shifts so as to be able to stay indoors.  Denied any anxiety or depression at this time.   Past Medical History:  Diagnosis Date  . Scoliosis     No past surgical history on file.  Family History  Problem Relation Age of Onset  . Sickle cell anemia Mother   . CVA Mother        CASUSED BY SICKLE CELL   . Seizures  Mother        after cva  . Healthy Father        Social History:  reports that he has been smoking cigarettes. He has been smoking about 0.20 packs per day. He has never used smokeless tobacco. He reports that he does not drink alcohol or use drugs.  No Known Allergies  MEDICATIONS:  Current Facility-Administered Medications  Medication Dose Route Frequency Provider Last Rate Last Admin  . LORazepam (ATIVAN) 2 MG/ML injection            Current Outpatient Medications  Medication Sig Dispense Refill  . ibuprofen (ADVIL) 800 MG tablet Take 1 tablet (800 mg total) by mouth 3 (three) times daily with meals. (Patient not taking: Reported on 03/20/2019) 21 tablet 0  . ondansetron (ZOFRAN-ODT) 4 MG disintegrating tablet Take 1 tablet (4 mg total) by mouth every 8 (eight) hours as needed for nausea or vomiting. (Patient not taking: Reported on 03/20/2019) 15 tablet 0     ROS:                                                                                                                                       ROS was performed and is negative except as noted in HPI  Blood pressure 121/73, pulse (!) 51, temperature (!) 97.4 F (36.3 C), temperature source Axillary, resp. rate (!) 22, SpO2 100 %. General Examination:                                                                                                       Physical Exam  Constitutional: Appears well-developed and well-nourished.  Psych: Affect appropriate to situation Eyes: Normal external eye and conjunctiva. HENT: Normocephalic, no lesions, without obvious abnormality.   Musculoskeletal-no joint tenderness, deformity or swelling Cardiovascular: Normal rate and regular rhythm.  Respiratory: Effort normal, non-labored breathing saturations WNL GI: Soft.  No distension. There is no tenderness.  Skin: WDI Neurological  Examination Mental Status: Drowsy, oriented, thought content appropriate.  Speech fluent and slow without evidence of aphasia.  Able to follow commands without difficulty. Cranial Nerves: PP:JKDTOI fields grossly normal,  III,IV, VI: ptosis not present, extra-ocular motions intact bilaterally pupils equal, round, reactive to light and accommodation V,VII: smile symmetric, facial light touch sensation normal bilaterally VIII: hearing normal bilaterally IX,X: uvula rises symmetrically XI: bilateral shoulder shrug XII: midline tongue extension Motor: Able to lift all 4 extremities anti gravity Tone and bulk:normal tone throughout; no atrophy noted Sensory:light touch intact throughout, bilaterally Deep Tendon Reflexes: 2+ brisk and symmetric throughout Cerebellar: No ataxia Gait: deferred   Lab Results: Basic Metabolic Panel: Recent Labs  Lab 03/18/19 1746 03/20/19 1016  NA 138 139  K 3.7 4.2  CL 102 103  CO2 26 24  GLUCOSE 88 89  BUN 10 8  CREATININE 0.79 0.96  CALCIUM 9.9 9.9  CBC: Recent Labs  Lab 03/18/19 1746 03/20/19 1016  WBC 7.6 5.8  NEUTROABS  --  4.2  HGB 13.8 13.4  HCT 38.8* 38.4*  MCV 88.0 89.1  PLT 254 284    Imaging: CT Head Wo Contrast  Result Date: 03/20/2019 CLINICAL DATA:  Seizure. Weakness and tingling in left leg for 3 days. EXAM: CT HEAD WITHOUT CONTRAST TECHNIQUE: Contiguous axial images were obtained from the base of the skull through the vertex without intravenous contrast. COMPARISON:  02/16/2018. FINDINGS: Brain: No mass lesion, hemorrhage, hydrocephalus, acute infarct, intra-axial, or extra-axial fluid collection. Vascular: No hyperdense vessel or unexpected calcification. Skull: No significant soft tissue swelling.  No skull fracture. Sinuses/Orbits: Normal imaged portions of the orbits and globes. Clear paranasal sinuses and mastoid air cells. Other: None. IMPRESSION: Normal head CT. Electronically Signed   By: Abigail Miyamoto M.D.   On:  03/20/2019 11:32   Assessment: Richard [name] is an 19 y.o. male  Who presented to [facility] ED after a seizure like episode along with prolonged postictal state. Patient has been feeling generally tired for about a week.  With bilateral leg weakness. Patient has  Been afebrile and was seen in [facility] ED yesterday for numbness/tingling/ generalized weakness. On exam no neck stiffness, but will obtain LP to r/o encephalitis given the viral prodrome. Will obtain MRI and EEG. Seizure was witnessed by the witnessing people are not available to describe the seizure-no reason for me to suspect a nonepileptic event but some preceding information that he shared with some friends about being stressed/homeless-would make me keep psychogenic nonepileptic etiology lower on the differential if all the other work-up remains unremarkable.  Recommendations: --MRI brain --EEG --LP --Acyclovir coverage for HSV encephalitis till the HSV PCR results come back. --No antiepileptics for now.  Low threshold to start antiepileptics. -- Laurey Morale, MSN, NP-C [facility] [phone]  Attending neurologist's note to follow  Attending Neurohospitalist Addendum Patient seen and examined with APP/Resident. Agree with the history and physical as documented above. Agree with the plan as documented, which I helped formulate. I have independently reviewed the chart, obtained history, review of systems and examined the patient.I have personally reviewed pertinent head/neck/spine imaging (CT/MRI). Please feel free to call with any questions. --- Amie Portland, MD [facility] Pager: [phone] If 7pm to 7am, please call on call as listed on AMION.

## 2019-03-20 NOTE — Procedures (Addendum)
Patient Name: Richard [name]  MRN: [id number]  Epilepsy Attending: Charlsie Quest  Referring Physician/Provider: Valentina Lucks, NP Date: 03/20/2019 Duration: 23.30 mins  Patient history: [name] is an 19 y.o. male  Who presented to [facility] ED after a seizure like episode along with prolonged postictal state. EEG to evaluate for seizure  Level of alertness: awake and asleep  AEDs during EEG study: ativan  Technical aspects: This EEG study was done with scalp electrodes positioned according to the 10-20 International system of electrode placement. Electrical activity was acquired at a sampling rate of 500Hz  and reviewed with a high frequency filter of 70Hz  and a low frequency filter of 1Hz . EEG data were recorded continuously and digitally stored.   DESCRIPTION: The posterior dominant rhythm consists of 10 Hz activity of moderate voltage (25-35 uV) seen predominantly in posterior head regions, symmetric and reactive to eye opening and eye closing. Sleep was characterized by vertex waves, sleep spindles (12-14hz ), maximal frontocentral. No EEG change was seen during hyperventilation. Physiologic photic driving was seen during photic stimulation.     IMPRESSION: This study is within normal limits. No seizures or epileptiform discharges were seen throughout the recording.  Azucena Dart 

## 2019-03-20 NOTE — ED Provider Notes (Signed)
[name] EMERGENCY DEPARTMENT Provider Note   CSN: [id number] Arrival date & time: 03/20/19  1004     History Chief Complaint  Patient presents with  . Seizures    Richard [name] is a 19 y.o. male.  Patient is a 19 year old male with no known significant medical history presenting today with rapid response after having a seizure while in the break room.  Patient started having generalized shaking but staff caught him prior to him falling to the floor.  Patient was seen yesterday in the emergency room and urgent care for generalized malaise, nausea and just not feeling well.  Per their notes this had been going on for 4 days.  Patient had blood work with CBC, BMP, chest x-ray, UDS and UA done yesterday all which showed no acute findings.  Patient was given a small bolus of fluid and per emergency room note was feeling better prior to discharge.  Patient currently is not awake or alert or able to give any history.  Staff reported he had been complaining of a headache and may be some dental pain over the last few days but today he suddenly became unresponsive.  Upon arrival to the emergency room patient is still not responding.  He is noted to have generalized shaking intermittently which seems to be more present in the right arm and leg.  The history is provided by medical records (rapid response). The history is limited by the condition of the patient.  Seizures Seizure activity on arrival: yes   Seizure type:  Grand mal      Past Medical History:  Diagnosis Date  . Scoliosis     There are no problems to display for this patient.   No past surgical history on file.     Family History  Problem Relation Age of Onset  . Sickle cell anemia Mother   . Healthy Father     Social History   Tobacco Use  . Smoking status: Current Every Day Smoker    Packs/day: 0.20    Types: Cigarettes  . Smokeless tobacco: Never Used  . Tobacco comment: 2 cigs per day    Substance Use Topics  . Alcohol use: Never  . Drug use: Never    Home Medications Prior to Admission medications   Medication Sig Start Date End Date Taking? Authorizing Provider  ibuprofen (ADVIL) 800 MG tablet Take 1 tablet (800 mg total) by mouth 3 (three) times daily with meals. 03/18/19   Mardella Layman, MD  ondansetron (ZOFRAN-ODT) 4 MG disintegrating tablet Take 1 tablet (4 mg total) by mouth every 8 (eight) hours as needed for nausea or vomiting. 03/18/19   Mardella Layman, MD    Allergies    Patient has no known allergies.  Review of Systems   Review of Systems  Unable to perform ROS: Mental status change  Neurological: Positive for seizures.    Physical Exam Updated Vital Signs BP 118/74   Pulse 70   Temp (!) 97.4 F (36.3 C) (Axillary)   Resp 17   SpO2 100%   Physical Exam Vitals and nursing note reviewed.  Constitutional:      General: He is not in acute distress.    Appearance: He is well-developed and normal weight.     Comments: Patient is actively seizing and not responding  HENT:     Head: Normocephalic and atraumatic.     Mouth/Throat:     Mouth: Mucous membranes are moist.     Comments:  Currently clenched down and unable to evaluate the tongue Eyes:     Conjunctiva/sclera: Conjunctivae normal.     Pupils: Pupils are equal, round, and reactive to light.  Cardiovascular:     Rate and Rhythm: Normal rate and regular rhythm.     Heart sounds: No murmur.  Pulmonary:     Effort: Pulmonary effort is normal. No respiratory distress.     Breath sounds: Normal breath sounds. No wheezing or rales.  Abdominal:     General: There is no distension.     Palpations: Abdomen is soft.     Tenderness: There is no abdominal tenderness. There is no guarding or rebound.  Musculoskeletal:        General: No tenderness. Normal range of motion.     Cervical back: Normal range of motion and neck supple.  Skin:    General: Skin is warm and dry.     Capillary Refill:  Capillary refill takes less than 2 seconds.     Findings: No erythema or rash.  Neurological:     Comments: Actively seizing on arrival.  More notable tonic-clonic movements of the right arm and leg.  This activity is intermittent.  Occasional eye blinking but no eye deviation.  Psychiatric:     Comments: Unresponsive      ED Results / Procedures / Treatments   Labs (all labs ordered are listed, but only abnormal results are displayed) Labs Reviewed  CBC WITH DIFFERENTIAL/PLATELET - Abnormal; Notable for the following components:      Result Value   HCT 38.4 (*)    All other components within normal limits  COMPREHENSIVE METABOLIC PANEL - Abnormal; Notable for the following components:   Total Bilirubin 1.5 (*)    All other components within normal limits  RESPIRATORY PANEL BY RT PCR (FLU A&B, COVID)  ETHANOL  RAPID URINE DRUG SCREEN, HOSP PERFORMED    EKG EKG Interpretation  Date/Time:  Friday March 20 2019 10:11:50 EST Ventricular Rate:  62 PR Interval:    QRS Duration: 80 QT Interval:  380 QTC Calculation: 386 R Axis:   81 Text Interpretation: Sinus rhythm ST elev, probable normal early repol pattern No significant change since last tracing Confirmed by Gwyneth Sprout ([id number]) on 03/20/2019 10:31:37 AM   Radiology CT Head Wo Contrast  Result Date: 03/20/2019 CLINICAL DATA:  Seizure. Weakness and tingling in left leg for 3 days. EXAM: CT HEAD WITHOUT CONTRAST TECHNIQUE: Contiguous axial images were obtained from the base of the skull through the vertex without intravenous contrast. COMPARISON:  02/16/2018. FINDINGS: Brain: No mass lesion, hemorrhage, hydrocephalus, acute infarct, intra-axial, or extra-axial fluid collection. Vascular: No hyperdense vessel or unexpected calcification. Skull: No significant soft tissue swelling.  No skull fracture. Sinuses/Orbits: Normal imaged portions of the orbits and globes. Clear paranasal sinuses and mastoid air cells. Other: None.  IMPRESSION: Normal head CT. Electronically Signed   By: Jeronimo Greaves M.D.   On: 03/20/2019 11:32    Procedures .Lumbar Puncture  Date/Time: 03/20/2019 3:26 PM Performed by: Gwyneth Sprout, MD Authorized by: Gwyneth Sprout, MD   Consent:    Consent obtained:  Verbal   Consent given by:  Patient   Risks discussed:  Bleeding, infection, headache and pain   Alternatives discussed:  No treatment Pre-procedure details:    Procedure purpose:  Diagnostic   Preparation: Patient was prepped and draped in usual sterile fashion   Anesthesia (see MAR for exact dosages):    Anesthesia method:  Local infiltration  Local anesthetic:  Lidocaine 2% WITH epi Procedure details:    Lumbar space:  L4-L5 interspace   Patient position:  Sitting   Needle gauge:  22   Needle type:  Diamond point   Needle length (in):  1.5   Ultrasound guidance: no     Number of attempts:  1   Fluid appearance:  Clear   Tubes of fluid:  4   Total volume (ml):  5 Post-procedure:    Puncture site:  Adhesive bandage applied   Patient tolerance of procedure:  Tolerated well, no immediate complications   (including critical care time)  Medications Ordered in ED Medications  LORazepam (ATIVAN) 2 MG/ML injection (has no administration in time range)  LORazepam (ATIVAN) 2 MG/ML injection (2 mg  Given 03/20/19 1006)    ED Course  I have reviewed the triage vital signs and the nursing notes.  Pertinent labs & imaging results that were available during my care of the patient were reviewed by me and considered in my medical decision making (see chart for details).    MDM Rules/Calculators/A&P                     19 year old male presenting today with new onset seizure.  Mom reports that patient has never had seizures before.  Seizure activity was generalized but then became more localized to the right arm and leg.  Patient did receive 2 mg of Ativan and then appeared to stop seizing.  He would open his eyes to voice  but we will continue to monitor.  Patient had been seen recently in the last day for general malaise, nausea and not feeling himself.  Patient did have normal labs yesterday and chest x-ray.  Unclear source of seizure at this time.  Concern for infectious etiology versus Covid versus brain abnormality.  Head CT is pending and rapid Covid is pending.  We will continue to monitor closely.  Drug screen was negative yesterday.  12:58 PM Patient is still very groggy and not back to baseline.  Head CT is negative, labs are reassuring.  Speaking with a coworker patient over the last few weeks to a month complains of regular headaches, feeling generally drained and tired.  Patient has been working a lot a double shifts and has confided in coworker that he is homeless so he is currently staying at [employer] or in the park.  Coworker states there is a lot going on and he cannot be at home.  Patient's mother is at bedside currently and states she has a history of seizures and does take medication for them but he has never had febrile seizures or any other type of seizure.  He takes no prescription medications and is healthy as far she knows.  Coworker denies any history of him stating that he wants to hurt himself but she states he does deal with a lot of anger even to the point where it might threaten his job.  But he has never confided in her about using any type of substances. Given patient's ongoing abnormal mental status, prolonged seizure today for the first time.  Will have neurology come and evaluate.  2:36 PM Patient is more awake now on exam.  Neurology request that an LP be done just to ensure there is no evidence for encephalitis.  Also they recommend an MRI and a 24-hour EEG.  Patient consents for an LP and fluid is sent.  CRITICAL CARE Performed by: Alayia Meggison Total  critical care time: 30 minutes Critical care time was exclusive of separately billable procedures and treating other  patients. Critical care was necessary to treat or prevent imminent or life-threatening deterioration. Critical care was time spent personally by me on the following activities: development of treatment plan with patient and/or surrogate as well as nursing, discussions with consultants, evaluation of patient's response to treatment, examination of patient, obtaining history from patient or surrogate, ordering and performing treatments and interventions, ordering and review of laboratory studies, ordering and review of radiographic studies, pulse oximetry and re-evaluation of patient's condition.   Final Clinical Impression(s) / ED Diagnoses Final diagnoses:  Seizure-like activity [facility])    Rx / DC Orders ED Discharge Orders    None       Gwyneth Sprout, MD 03/20/19 1527

## 2019-03-20 NOTE — H&P (Addendum)
History and Physical  Richard [name] TKZ:[id number] DOB: [birth year] DOA: 03/20/2019  Referring physician: Dr. Maryan Rued PCP: Patient, No Pcp Per  Outpatient Specialists: None Patient coming from: Work, [employer] [facility] [place] Complaint: Seizure like activity at work  HPI: Richard [name] is a 19 y.o. [name] with no significant past medical history on no prescribed medication who was brought in to the ED at [facility] from work at [employer] due to seizure like activity.  After arriving to the ED he had another seizure like activity for which he was given IV Ativan push.  Neurology consulted by EDP, Dr. Rory Percy, requested MRI, EEG and LP.  CT head non acute and unremarkable.  EEG showed no evidence of seizure or epileptiform activity.  LP completed.  Neurology recommended starting Acyclovir until HSV encephalitis is ruled out.   Also recommended to hold off AEDs for now.  Patient is able to answer questions.  Had a prior episode of shaking uncontrollably in September 2020, was aware of it but could not stop himself.  Reports bilateral lower extremity weakness associated with tingling for 6 days and similar symptoms in his upper extremities since Sunday, 6 days ago after eating a burger that evening.  He is concerned about inability to have an erection for the past 2 days.  In a monogamous heterosexual relationship.  No urinary or bowel incontinence.  +constipation with hard stools.  Smoked marijuana 4 days ago provided by same supplier.  No use of alcohol or other illicit drugs.  [facility] asked to admit.  ED Course: UDS and alcohol level undetectable. EKG in sinus rhythm without specific ST T changes. Vital signs stable. Labs studies unremarkable except for mildly elevated T. Bilirubin.   Review of Systems: Review of systems as noted in the HPI. All other systems reviewed and are negative.   Past Medical History:  Diagnosis Date  . Scoliosis   . Seizure ([place]) 03/20/2019   No past surgical  history on file.  Social History:  reports that he has been smoking cigarettes. He has been smoking about 0.20 packs per day. He has never used smokeless tobacco. He reports that he does not drink alcohol or use drugs.   No Known Allergies  Family History  Problem Relation Age of Onset  . Sickle cell anemia Mother   . CVA Mother        CASUSED BY SICKLE CELL   . Seizures Mother        after cva  . Healthy Father      Prior to Admission medications   Medication Sig Start Date End Date Taking? Authorizing Provider  ibuprofen (ADVIL) 800 MG tablet Take 1 tablet (800 mg total) by mouth 3 (three) times daily with meals. Patient not taking: Reported on 03/20/2019 03/18/19   Vanessa Kick, MD  ondansetron (ZOFRAN-ODT) 4 MG disintegrating tablet Take 1 tablet (4 mg total) by mouth every 8 (eight) hours as needed for nausea or vomiting. Patient not taking: Reported on 03/20/2019 03/18/19   Vanessa Kick, MD    Physical Exam: BP (!) 115/94   Pulse (!) 51   Temp (!) 97.4 F (36.3 C) (Axillary)   Resp 18   Ht '5\' 6"'  (1.676 m)   Wt 58.1 kg   SpO2 100%   BMI 20.66 kg/m   . General: 19 y.o. year-old [name] well developed well nourished in no acute distress.  Alert and oriented x3. . Cardiovascular: Regular rate and rhythm with no rubs  or gallops.  No thyromegaly or JVD noted.  No lower extremity edema. 2/4 pulses in all 4 extremities. Marland Kitchen Respiratory: Clear to auscultation with no wheezes or rales. Good inspiratory effort. . Abdomen: Soft nontender nondistended with normal bowel sounds x4 quadrants. . Muskuloskeletal: No cyanosis, clubbing or edema noted bilaterally . Neuro: CN II-XII intact, strength, sensation, reflexes . Skin: No ulcerative lesions noted or rashes . Psychiatry: Judgement and insight appear normal. Mood is appropriate for condition and setting          Labs on Admission:  Basic Metabolic Panel: Recent Labs  Lab 03/18/19 1746 03/20/19 1016  NA 138 139  K 3.7 4.2  CL  102 103  CO2 26 24  GLUCOSE 88 89  BUN 10 8  CREATININE 0.79 0.96  CALCIUM 9.9 9.9   Liver Function Tests: Recent Labs  Lab 03/20/19 1016  AST 28  ALT 40  ALKPHOS 61  BILITOT 1.5*  PROT 7.4  ALBUMIN 4.6   No results for input(s): LIPASE, AMYLASE in the last 168 hours. No results for input(s): AMMONIA in the last 168 hours. CBC: Recent Labs  Lab 03/18/19 1746 03/20/19 1016  WBC 7.6 5.8  NEUTROABS  --  4.2  HGB 13.8 13.4  HCT 38.8* 38.4*  MCV 88.0 89.1  PLT 254 284   Cardiac Enzymes: No results for input(s): CKTOTAL, CKMB, CKMBINDEX, TROPONINI in the last 168 hours.  BNP (last 3 results) No results for input(s): BNP in the last 8760 hours.  ProBNP (last 3 results) No results for input(s): PROBNP in the last 8760 hours.  CBG: Recent Labs  Lab 03/18/19 1040 03/18/19 2110  GLUCAP 70  70  70 165*    Radiological Exams on Admission: CT Head Wo Contrast  Result Date: 03/20/2019 CLINICAL DATA:  Seizure. Weakness and tingling in left leg for 3 days. EXAM: CT HEAD WITHOUT CONTRAST TECHNIQUE: Contiguous axial images were obtained from the base of the skull through the vertex without intravenous contrast. COMPARISON:  02/16/2018. FINDINGS: Brain: No mass lesion, hemorrhage, hydrocephalus, acute infarct, intra-axial, or extra-axial fluid collection. Vascular: No hyperdense vessel or unexpected calcification. Skull: No significant soft tissue swelling.  No skull fracture. Sinuses/Orbits: Normal imaged portions of the orbits and globes. Clear paranasal sinuses and mastoid air cells. Other: None. IMPRESSION: Normal head CT. Electronically Signed   By: Abigail Miyamoto M.D.   On: 03/20/2019 11:32   EEG adult  Result Date: 03/20/2019 Lora Havens, MD     03/20/2019  5:20 PM Patient Name: Richard [name] MRN: [id number] Epilepsy Attending: Lora Havens Referring Physician/Provider: Laurey Morale, NP Date: 03/20/2019 Duration: 23.30 mins Patient history: Richard [name] is an 19 y.o. [name]   Who presented to [facility] ED after a seizure like episode along with prolonged postictal state. EEG to evaluate for seizure Level of alertness: awake and asleep AEDs during EEG study: ativan Technical aspects: This EEG study was done with scalp electrodes positioned according to the 10-20 International system of electrode placement. Electrical activity was acquired at a sampling rate of '500Hz'  and reviewed with a high frequency filter of '70Hz'  and a low frequency filter of '1Hz' . EEG data were recorded continuously and digitally stored. DESCRIPTION: The posterior dominant rhythm consists of 10 Hz activity of moderate voltage (25-35 uV) seen predominantly in posterior head regions, symmetric and reactive to eye opening and eye closing. Sleep was characterized by vertex waves, sleep spindles (12-'14hz' ), maximal frontocentral. No EEG change was seen during hyperventilation. Physiologic photic driving  was seen during photic stimulation.   IMPRESSION: This study is within normal limits. No seizures or epileptiform discharges were seen throughout the recording. Priyanka O Yadav    EKG: I independently viewed the EKG done and my findings are as followed: Sinus rhythm rate 67.  Assessment/Plan Present on Admission: **None**  Active Problems:   Seizure (HCC)  Seizure like activity Unclear if psychogenic No personal or family history of seizures UDS and alcohol level undetectable EEG no seizure or epileptiform activity Hold off AED per neurology MRI pending LP fluid sent for analysis, follow Started acyclovir coverage for HSV encephalitis at neurology's recommendation until HSV PCR results come back. Seizure precautions IV Ativan prn for seizures Monitor on telemetry overnight. Neurology following.  Appreciate assistance.  Acute bilateral lower extremity weakness, unclear etiology Will obtain CPK and ESR No particular back pain prior to presentation If CSF analysis comes back negative may consider MRI  spine Defer to neurology to decide.  Transient bradycardia Unclear etiology Add on TSH  Hyperbilirubinemia, isolated Unclear etiology Repeat level in the AM  New constipation States unusual for him to be constipated with hard stools Start senokot 2 tabs BID  Scoliosis No apparent acute issue  Intermittent dyspnea Personally reviewed admission chest xray which shows clear lungs O2 sat 100% RA    DVT prophylaxis: SCDs  Code Status: Full code per the patient himself.  Family Communication: None at bedside. Patient declines call to his mother to give updates.  Disposition Plan: Admit to telemetry medical   Consults called: Neurology by EDP   Admission status: Observation.     Kayleen Memos MD [facility] Pager [phone]  If 7PM-7AM, please contact night-coverage [email or web address] Password [facility]  03/20/2019, 5:38 PM

## 2019-03-20 NOTE — Progress Notes (Signed)
Pharmacy Antibiotic Note  Richard [name] is a 19 y.o. male admitted on 03/20/2019 with seizures.  Pharmacy has been consulted for acyclovir dosing for possible HSV encephalitis. Pt is afebrile and WBC is WNL.   Plan: Acyclovir 10mg /kg IV Q8H F/u renal fxn, C&S, clinical status  Height: 5\' 6"  (167.6 cm) Weight: 128 lb (58.1 kg) IBW/kg (Calculated) : 63.8  Temp (24hrs), Avg:97.4 F (36.3 C), Min:97.4 F (36.3 C), Max:97.4 F (36.3 C)  Recent Labs  Lab 03/18/19 1746 03/20/19 1016  WBC 7.6 5.8  CREATININE 0.79 0.96    Estimated Creatinine Clearance: 100.9 mL/min (by C-G formula based on SCr of 0.96 mg/dL).    No Known Allergies  Antimicrobials this admission: Acyclovir 1/1>>  Dose adjustments this admission: N/A  Microbiology results: Pending  Thank you for allowing pharmacy to be a part of this patient's care.  Arbadella Kimbler, 03/20/19 03/20/2019 5:41 PM

## 2019-03-20 NOTE — Progress Notes (Signed)
CSF   Less likely infectious. I would recommend that we still follow HSV PCR and keep acyclovir on for now.  We will follow him with you.  -- Milon Dikes, MD [facility] Pager: [phone] If 7pm to 7am, please call on call as listed on AMION.

## 2019-03-20 NOTE — Progress Notes (Signed)
EEG complete - results pending 

## 2019-03-21 ENCOUNTER — Inpatient Hospital Stay (HOSPITAL_COMMUNITY): Payer: Self-pay

## 2019-03-21 DIAGNOSIS — R569 Unspecified convulsions: Secondary | ICD-10-CM

## 2019-03-21 DIAGNOSIS — F419 Anxiety disorder, unspecified: Secondary | ICD-10-CM

## 2019-03-21 LAB — HSV DNA BY PCR (REFERENCE LAB)
HSV 1 DNA: NEGATIVE
HSV 2 DNA: NEGATIVE

## 2019-03-21 LAB — BASIC METABOLIC PANEL
Anion gap: 11 (ref 5–15)
Anion gap: 7 (ref 5–15)
BUN: 9 mg/dL (ref 6–20)
BUN: 9 mg/dL (ref 6–20)
CO2: 26 mmol/L (ref 22–32)
CO2: 26 mmol/L (ref 22–32)
Calcium: 9.4 mg/dL (ref 8.9–10.3)
Calcium: 9.8 mg/dL (ref 8.9–10.3)
Chloride: 101 mmol/L (ref 98–111)
Chloride: 104 mmol/L (ref 98–111)
Creatinine, Ser: 0.9 mg/dL (ref 0.61–1.24)
Creatinine, Ser: 0.94 mg/dL (ref 0.61–1.24)
GFR calc Af Amer: >60 mL/min (ref >60)
GFR calc Af Amer: >60 mL/min (ref >60)
GFR calc non Af Amer: >60 mL/min (ref >60)
GFR calc non Af Amer: >60 mL/min (ref >60)
Glucose, Bld: 85 mg/dL (ref 70–99)
Glucose, Bld: 112 mg/dL — ABNORMAL HIGH (ref 70–99)
Potassium: 3.6 mmol/L (ref 3.5–5.1)
Potassium: 3.8 mmol/L (ref 3.5–5.1)
Sodium: 138 mmol/L (ref 135–145)
Sodium: 137 mmol/L (ref 135–145)

## 2019-03-21 LAB — CBC
HCT: 38 % — ABNORMAL LOW (ref 39.0–52.0)
Hemoglobin: 13.4 g/dL (ref 13.0–17.0)
MCH: 30.8 pg (ref 26.0–34.0)
MCHC: 35.3 g/dL (ref 30.0–36.0)
MCV: 87.4 fL (ref 80.0–100.0)
Platelets: 277 10*3/uL (ref 150–400)
RBC: 4.35 MIL/uL (ref 4.22–5.81)
RDW: 13.3 % (ref 11.5–15.5)
WBC: 5.5 10*3/uL (ref 4.0–10.5)
nRBC: 0 % (ref 0.0–0.2)

## 2019-03-21 LAB — GLUCOSE, CAPILLARY: Glucose-Capillary: 120 mg/dL — ABNORMAL HIGH (ref 70–99)

## 2019-03-21 LAB — HEPATIC FUNCTION PANEL
ALT: 30 U/L (ref 0–44)
AST: 19 U/L (ref 15–41)
Albumin: 4.1 g/dL (ref 3.5–5.0)
Alkaline Phosphatase: 57 U/L (ref 38–126)
Bilirubin, Direct: 0.2 mg/dL (ref 0.0–0.2)
Indirect Bilirubin: 1 mg/dL — ABNORMAL HIGH (ref 0.3–0.9)
Total Bilirubin: 1.2 mg/dL (ref 0.3–1.2)
Total Protein: 6.6 g/dL (ref 6.5–8.1)

## 2019-03-21 LAB — HIV ANTIBODY (ROUTINE TESTING W REFLEX): HIV Screen 4th Generation wRfx: NONREACTIVE

## 2019-03-21 LAB — CK: Total CK: 421 U/L — ABNORMAL HIGH (ref 49–397)

## 2019-03-21 MED ORDER — ADULT MULTIVITAMIN W/MINERALS CH
1.0000 | ORAL_TABLET | Freq: Every day | ORAL | Status: DC
  Administered 2019-03-21 – 2019-03-22 (×2): 1 via ORAL
  Filled 2019-03-21 (×2): qty 1

## 2019-03-21 MED ORDER — MAGNESIUM CITRATE PO SOLN
1.0000 | Freq: Once | ORAL | Status: AC
  Administered 2019-03-21: 1 via ORAL
  Filled 2019-03-21: qty 296

## 2019-03-21 NOTE — Progress Notes (Signed)
Zovirax iv infusion completed. Iv to left anterior upper forearm  , flushed and  saline locked for MRI procedure.

## 2019-03-21 NOTE — Progress Notes (Addendum)
Neurology Progress Note   S:// Seen and examined. Continues to complain of leg weakness bilaterally Also complains of not having had an erection since Sunday. No bowel bladder incontinence.  Reports constipation Has a history of scoliosis which was not treated with any intervention  O:// Current vital signs: BP 119/64 (BP Location: Left Arm)   Pulse 72   Temp 98.1 F (36.7 C) (Oral)   Resp 18   Ht 5\' 6"  (1.676 m)   Wt 58.1 kg   SpO2 100%   BMI 20.66 kg/m  Vital signs in last 24 hours: Temp:  [97.4 F (36.3 C)-98.3 F (36.8 C)] 98.1 F (36.7 C) (01/02 0538) Pulse Rate:  [51-72] 72 (01/02 0538) Resp:  [15-22] 18 (01/02 0538) BP: (105-136)/(64-94) 119/64 (01/02 0538) SpO2:  [98 %-100 %] 100 % (01/02 0538) Weight:  [58.1 kg] 58.1 kg (01/01 1727) General: Awake alert in no distress HEENT: Normocephalic atraumatic, no evidence of tongue bite Cardiovascular: Regular rate rhythm Respiratory: Breathing normally saturating well on room air Extremities warm well perfused Abdomen: Nondistended nontender Neurological examination awake alert oriented x3 Speech is not dysarthric There is no evidence of aphasia Cranial nerves: Pupils equal round react light, extract movements intact, visual fields full, face symmetric, facial sensation intact, auditory acuity intact, palate midline, shoulder shrug intact, tongue midline. Motor exam: Upper extremities bilaterally 5/5.  Lower extremities 4+/5 bilaterally.  With normal tone.  Normal range of motion. Sensory exam: Intact to light touch without extinction.   Medications  Current Facility-Administered Medications:  .  acetaminophen (TYLENOL) tablet 650 mg, 650 mg, Oral, Q6H PRN, Hall, Carole N, DO .  acyclovir (ZOVIRAX) 580 mg in dextrose 5 % 100 mL IVPB, 10 mg/kg, Intravenous, Q8H, Rumbarger, Rachel L, RPH, Last Rate: 111.6 mL/hr at 03/21/19 0233, 580 mg at 03/21/19 0233 .  LORazepam (ATIVAN) injection 2 mg, 2 mg, Intravenous, Q6H PRN,  Kayleen Memos, DO .  ondansetron (ZOFRAN) injection 4 mg, 4 mg, Intravenous, Q6H PRN, Irene Pap N, DO .  senna-docusate (Senokot-S) tablet 2 tablet, 2 tablet, Oral, BID, Irene Pap N, DO, 2 tablet at 03/21/19 5809 Labs CBC    Component Value Date/Time   WBC 5.5 03/21/2019 0452   RBC 4.35 03/21/2019 0452   HGB 13.4 03/21/2019 0452   HCT 38.0 (L) 03/21/2019 0452   PLT 277 03/21/2019 0452   MCV 87.4 03/21/2019 0452   MCH 30.8 03/21/2019 0452   MCHC 35.3 03/21/2019 0452   RDW 13.3 03/21/2019 0452   LYMPHSABS 1.1 03/20/2019 1016   MONOABS 0.5 03/20/2019 1016   EOSABS 0.0 03/20/2019 1016   BASOSABS 0.0 03/20/2019 1016    CMP     Component Value Date/Time   NA 138 03/21/2019 0452   K 3.6 03/21/2019 0452   CL 101 03/21/2019 0452   CO2 26 03/21/2019 0452   GLUCOSE 85 03/21/2019 0452   BUN 9 03/21/2019 0452   CREATININE 0.90 03/21/2019 0452   CALCIUM 9.4 03/21/2019 0452   PROT 6.6 03/21/2019 0452   ALBUMIN 4.1 03/21/2019 0452   AST 19 03/21/2019 0452   ALT 30 03/21/2019 0452   ALKPHOS 57 03/21/2019 0452   BILITOT 1.2 03/21/2019 0452   GFRNONAA >60 03/21/2019 0452   GFRAA >60 03/21/2019 0452   Imaging I have reviewed images in epic and the results pertinent to this consultation are: CT head no acute abnormality.  MRI brain with and without contrast-no acute abnormality.  Assessment: We are asked to see this 19 year old patient  who presented to the emergency room after a seizure-like episode and continued to be confused and lethargic concerning for prolonged postictal state.  He has been complaining of bilateral leg weakness for a week. Seizure was witnessed by some coworkers were not available to describe the seizures. There was a prodrome of not feeling well and generalized weakness and malaise prior to the seizure episode for which she had visited the ER in the urgent care center twice in the preceding 2 days. A spinal tap was performed to rule out HSV  encephalitis-preliminary studies do not look concerning for infection.  No pleocytosis and protein and glucose within normal limits. HSV PCR pending. He has not had seizures before, not even as a child. Primary concern for him today-leg weakness and erectile dysfunction.  Impression: Evaluate for seizures Evaluate for bilateral leg weakness and erectile dysfunction  Recommendations:  I would obtain a MRI of the lumbar spine to evaluate for lumbar spinal stenosis/cauda equina - which can present with both leg weakness and erectile dysfunction.  Uncommon for his age but he has scoliosis, so this evaluation is reasonable.  I would not start him on any antiepileptics for now.  Continue acyclovir till HSV PCR is negative  Maintain seizure precautions  Also prior to discharge, he should be given a detailed list of seizure precautions including no driving for [place] state law until 6 months seizure-free.  -- Per [facility] statutes, patients with seizures are not allowed to drive until they have been seizure-free for six months.   Use caution when using heavy equipment or power tools. Avoid working on ladders or at heights. Take showers instead of baths. Ensure the water temperature is not too high on the home water heater. Do not go swimming alone. Do not lock yourself in a room alone (i.e. bathroom). When caring for infants or small children, sit down when holding, feeding, or changing them to minimize risk of injury to the child in the event you have a seizure. Maintain good sleep hygiene. Avoid alcohol.   If patient has another seizure, call 911 and bring them back to the ED if: A. The seizure lasts longer than 5 minutes.  B. The patient doesn't wake shortly after the seizure or has new problems such as difficulty seeing, speaking or moving following the seizure C. The patient was injured during the seizure D. The patient has a temperature over 102 F (39C) E. The  patient vomited during the seizure and now is having trouble breathing --  -- Milon Dikes, MD [facility] Pager: [phone] If 7pm to 7am, please call on call as listed on AMION.

## 2019-03-21 NOTE — Plan of Care (Addendum)
Called by Dr. Benjamine Mola, due to patient having shaking of his whole body. By the time I reached bedside, head shaking and nearly resolved. Recommend noticed him to have had whole-body jerking but as soon as he got a text message he was able to receive the message on his phone and read and respond back. According to Dr. Zenaida Niece, symptoms highly suggestive of a psychological etiology. EEG non focal. MRI brain with no abnormalities.   From a neurological standpoint, MRI lumbar spine has been completed.  No evidence of cauda equina or lumbar spinal stenosis.  I would not start him on any antiepileptics.  HSV PCR pending-we will follow  Will have to maintain seizure precautions given witnessed seizure as documented in today's progress note.  -- Milon Dikes, MD [facility] Pager: [phone] If 7pm to 7am, please call on call as listed on AMION.

## 2019-03-21 NOTE — Plan of Care (Signed)
HSV 1&2 negative in CSF.  D/C Acyclovir.   -- Milon Dikes, MD [facility] Pager: [phone] If 7pm to 7am, please call on call as listed on AMION.

## 2019-03-21 NOTE — Progress Notes (Signed)
Progress Note    Richard [name]  GYI:[id number] DOB: [birth year]  DOA: 03/20/2019 PCP: Patient, No Pcp Per    Brief Narrative:    Medical records reviewed and are as summarized below:  [name] is an 19 y.o. male with no significant past medical history on no prescribed medication who was brought in to the ED at [facility] from work at [employer] due to seizure like activity.  After arriving to the ED he had another seizure like activity for which he was given IV Ativan push.  Neurology consulted by EDP, Dr. Wilford Corner, requested MRI, EEG and LP.  CT head non acute and unremarkable.  EEG showed no evidence of seizure or epileptiform activity.  LP completed.  Neurology recommended starting Acyclovir until HSV encephalitis is ruled out.   Also recommended to hold off AEDs for now.  Patient is able to answer questions.  Had a prior episode of shaking uncontrollably in September 2020, was aware of it but could not stop himself.  Reports bilateral lower extremity weakness associated with tingling for 6 days and similar symptoms in his upper extremities since Sunday, 6 days ago after eating a burger that evening.  He is concerned about inability to have an erection for the past 2 days.  In a monogamous heterosexual relationship.  No urinary or bowel incontinence.  +constipation with hard stools.  Smoked marijuana 4 days ago provided by same supplier.  No use of alcohol or other illicit drugs.  Assessment/Plan:   Active Problems:   Seizure (HCC)   Seizure-like activity (HCC)   Seizure like activity appears psychogenic No personal or family history of seizures UDS and alcohol level undetectable EEG no seizure or epileptiform activity Hold off AED per neurology MRI negative LP does not appear infectious Started acyclovir coverage for HSV encephalitis at neurology's recommendation until HSV PCR results come back. Seizure precautions  Acute bilateral lower extremity weakness, unclear  etiology Transient and unclear -does wake up with an erection  Transient bradycardia Unclear etiology tsh normal  Hyperbilirubinemia, isolated -from dehydration -has been eating "kale and water"  New constipation States unusual for him to be constipated with hard stools Bowel regimen  Scoliosis -MRI lumbar spine w/o acute osseous abnormality. No canal stenosis or evidence of neural impingement of the lumbar spine.  Intermittent dyspnea Personally reviewed admission chest xray which shows clear lungs O2 sat 100% RA -suspect panic attacks/anxiety-- says he has had these since starting work here ([employer]) in September   Family Communication/Anticipated D/C date and plan/Code Status   DVT prophylaxis: scd. Code Status: Full Code.  Family Communication: grandfather on phone Disposition Plan:    Medical Consultants:    Neuro  Subjective:   Says he is a spiritual person- and helps guide others  Objective:    Vitals:   03/20/19 2044 03/21/19 0538 03/21/19 1240 03/21/19 1408  BP: 136/77 119/64 132/79   Pulse: 70 72 62 85  Resp: 18 18 16    Temp: 98.3 F (36.8 C) 98.1 F (36.7 C) 98.7 F (37.1 C)   TempSrc: Oral Oral Oral   SpO2: 100% 100% 100%   Weight:      Height:        Intake/Output Summary (Last 24 hours) at 03/21/2019 1543 Last data filed at 03/21/2019 1500 Gross per 24 hour  Intake 734.42 ml  Output --  Net 734.42 ml   Filed Weights   03/20/19 1727  Weight: 58.1 kg    Exam:  in  bed, visiting with a young lady at the bedside Moves all 4 ext NAD No LED edema muscular  Data Reviewed:   I have personally reviewed following labs and imaging studies:  Labs: Labs show the following:   Basic Metabolic Panel: Recent Labs  Lab 03/18/19 1746 03/20/19 1016 03/21/19 0452 03/21/19 1410  NA 138 139 138 137  K 3.7 4.2 3.6 3.8  CL 102 103 101 104  CO2 26 24 26 26   GLUCOSE 88 89 85 112*  BUN 10 8 9 9   CREATININE 0.79 0.96 0.90 0.94  CALCIUM  9.9 9.9 9.4 9.8   GFR Estimated Creatinine Clearance: 103 mL/min (by C-G formula based on SCr of 0.94 mg/dL). Liver Function Tests: Recent Labs  Lab 03/20/19 1016 03/21/19 0452  AST 28 19  ALT 40 30  ALKPHOS 61 57  BILITOT 1.5* 1.2  PROT 7.4 6.6  ALBUMIN 4.6 4.1   No results for input(s): LIPASE, AMYLASE in the last 168 hours. No results for input(s): AMMONIA in the last 168 hours. Coagulation profile No results for input(s): INR, PROTIME in the last 168 hours.  CBC: Recent Labs  Lab 03/18/19 1746 03/20/19 1016 03/21/19 0452  WBC 7.6 5.8 5.5  NEUTROABS  --  4.2  --   HGB 13.8 13.4 13.4  HCT 38.8* 38.4* 38.0*  MCV 88.0 89.1 87.4  PLT 254 284 277   Cardiac Enzymes: Recent Labs  Lab 03/20/19 1016 03/21/19 1410  CKTOTAL 752* 421*   BNP (last 3 results) No results for input(s): PROBNP in the last 8760 hours. CBG: Recent Labs  Lab 03/18/19 1040 03/18/19 2110 03/21/19 1352  GLUCAP 70  70  70 165* 120*   D-Dimer: No results for input(s): DDIMER in the last 72 hours. Hgb A1c: No results for input(s): HGBA1C in the last 72 hours. Lipid Profile: No results for input(s): CHOL, HDL, LDLCALC, TRIG, CHOLHDL, LDLDIRECT in the last 72 hours. Thyroid function studies: Recent Labs    03/20/19 1805  TSH 0.548   Anemia work up: No results for input(s): VITAMINB12, FOLATE, FERRITIN, TIBC, IRON, RETICCTPCT in the last 72 hours. Sepsis Labs: Recent Labs  Lab 03/18/19 1746 03/20/19 1016 03/21/19 0452  WBC 7.6 5.8 5.5    Microbiology Recent Results (from the past 240 hour(s))  Respiratory Panel by RT PCR (Flu A&B, Covid) - Nasopharyngeal Swab     Status: None   Collection Time: 03/20/19 10:33 AM   Specimen: Nasopharyngeal Swab  Result Value Ref Range Status   SARS Coronavirus 2 by RT PCR NEGATIVE NEGATIVE Final    Comment: (NOTE) SARS-CoV-2 target nucleic acids are NOT DETECTED. The SARS-CoV-2 RNA is generally detectable in upper respiratoy specimens during  the acute phase of infection. The lowest concentration of SARS-CoV-2 viral copies this assay can detect is 131 copies/mL. A negative result does not preclude SARS-Cov-2 infection and should not be used as the sole basis for treatment or other patient management decisions. A negative result may occur with  improper specimen collection/handling, submission of specimen other than nasopharyngeal swab, presence of viral mutation(s) within the areas targeted by this assay, and inadequate number of viral copies (<131 copies/mL). A negative result must be combined with clinical observations, patient history, and epidemiological information. The expected result is Negative. Fact Sheet for Patients:  https://www.moore.com/https://www.fda.gov/media/142436/download Fact Sheet for Healthcare Providers:  https://www.young.biz/https://www.fda.gov/media/142435/download This test is not yet ap proved or cleared by the [place] FDA and  has been authorized for detection and/or diagnosis of SARS-CoV-2  by FDA under an Emergency Use Authorization (EUA). This EUA will remain  in effect (meaning this test can be used) for the duration of the COVID-19 declaration under Section 564(b)(1) of the Act, 21 U.S.C. section 360bbb-3(b)(1), unless the authorization is terminated or revoked sooner.    Influenza A by PCR NEGATIVE NEGATIVE Final   Influenza B by PCR NEGATIVE NEGATIVE Final    Comment: (NOTE) The Xpert Xpress SARS-CoV-2/FLU/RSV assay is intended as an aid in  the diagnosis of influenza from Nasopharyngeal swab specimens and  should not be used as a sole basis for treatment. Nasal washings and  aspirates are unacceptable for Xpert Xpress SARS-CoV-2/FLU/RSV  testing. Fact Sheet for Patients: PinkCheek.be Fact Sheet for Healthcare Providers: GravelBags.it This test is not yet approved or cleared by the [place] FDA and  has been authorized for detection and/or diagnosis of  SARS-CoV-2 by  FDA under an Emergency Use Authorization (EUA). This EUA will remain  in effect (meaning this test can be used) for the duration of the  Covid-19 declaration under Section 564(b)(1) of the Act, 21  U.S.C. section 360bbb-3(b)(1), unless the authorization is  terminated or revoked. Performed at [facility], [place] [address], [place], [place] [address]   CSF culture     Status: None (Preliminary result)   Collection Time: 03/20/19  3:07 PM   Specimen: CSF; Cerebrospinal Fluid  Result Value Ref Range Status   Specimen Description CSF  Final   Special Requests Normal  Final   Gram Stain CYTOSPIN SMEAR NO WBC SEEN NO ORGANISMS SEEN   Final   Culture   Final    NO GROWTH < 24 HOURS Performed at [facility], [place] [address] [place], [place] [address]    Report Status PENDING  Incomplete  SARS CORONAVIRUS 2 (TAT 6-24 HRS) Nasopharyngeal Nasopharyngeal Swab     Status: None   Collection Time: 03/20/19  5:15 PM   Specimen: Nasopharyngeal Swab  Result Value Ref Range Status   SARS Coronavirus 2 NEGATIVE NEGATIVE Final    Comment: (NOTE) SARS-CoV-2 target nucleic acids are NOT DETECTED. The SARS-CoV-2 RNA is generally detectable in upper and lower respiratory specimens during the acute phase of infection. Negative results do not preclude SARS-CoV-2 infection, do not rule out co-infections with other pathogens, and should not be used as the sole basis for treatment or other patient management decisions. Negative results must be combined with clinical observations, patient history, and epidemiological information. The expected result is Negative. Fact Sheet for Patients: SugarRoll.be Fact Sheet for Healthcare Providers: https://www.woods-mathews.com/ This test is not yet approved or cleared by the [place] FDA and  has been authorized for detection and/or diagnosis of SARS-CoV-2 by FDA under an Emergency Use  Authorization (EUA). This EUA will remain  in effect (meaning this test can be used) for the duration of the COVID-19 declaration under Section 56 4(b)(1) of the Act, 21 U.S.C. section 360bbb-3(b)(1), unless the authorization is terminated or revoked sooner. Performed at [facility], [place] [address] [place], [place] [address]     Procedures and diagnostic studies:  CT Head Wo Contrast  Result Date: 03/20/2019 CLINICAL DATA:  Seizure. Weakness and tingling in left leg for 3 days. EXAM: CT HEAD WITHOUT CONTRAST TECHNIQUE: Contiguous axial images were obtained from the base of the skull through the vertex without intravenous contrast. COMPARISON:  02/16/2018. FINDINGS: Brain: No mass lesion, hemorrhage, hydrocephalus, acute infarct, intra-axial, or extra-axial fluid collection. Vascular: No hyperdense  vessel or unexpected calcification. Skull: No significant soft tissue swelling.  No skull fracture. Sinuses/Orbits: Normal imaged portions of the orbits and globes. Clear paranasal sinuses and mastoid air cells. Other: None. IMPRESSION: Normal head CT. Electronically Signed   By: Jeronimo Greaves M.D.   On: 03/20/2019 11:32   MR BRAIN W WO CONTRAST  Result Date: 03/20/2019 CLINICAL DATA:  Sickle cell trait with witnessed seizure. Weakness and tingling of the left leg. EXAM: MRI HEAD WITHOUT AND WITH CONTRAST TECHNIQUE: Multiplanar, multiecho pulse sequences of the brain and surrounding structures were obtained without and with intravenous contrast. CONTRAST:  5.49mL GADAVIST GADOBUTROL 1 MMOL/ML IV SOLN COMPARISON:  Head CT same day FINDINGS: Brain: The brain has a normal appearance without evidence of malformation, atrophy, old or acute small or large vessel infarction, mass lesion, hemorrhage, hydrocephalus or extra-axial collection. Vascular: Major vessels at the base of the brain show flow. Venous sinuses appear patent. Skull and upper cervical spine: Normal. Sinuses/Orbits: Clear/normal. Other: None  significant. IMPRESSION: Normal examination. No evidence of stroke. No abnormality seen to explain seizure. Electronically Signed   By: Paulina Fusi M.D.   On: 03/20/2019 19:18   MR LUMBAR SPINE WO CONTRAST  Result Date: 03/21/2019 CLINICAL DATA:  Low back pain. Bilateral lower extremity weakness. Sickle cell trait EXAM: MRI LUMBAR SPINE WITHOUT CONTRAST TECHNIQUE: Multiplanar, multisequence MR imaging of the lumbar spine was performed. No intravenous contrast was administered. COMPARISON:  CT 02/16/2018 FINDINGS: Segmentation: Transitional anatomy. There are 12 rib-bearing thoracic type vertebral segments and 6 non rib-bearing lumbar type vertebral segments. The inferior most well developed disc space is designated as L6-S1. Alignment: Levo scoliotic lumbar curvature. No listhesis in the sagittal plane. Vertebrae: No fracture, evidence of discitis, or bone lesion. Intermediate to low T1 signal throughout the visualized osseous structures, likely reflecting patient's known sickle cell trait. Conus medullaris and cauda equina: Conus extends to the L2 level. Conus and cauda equina appear normal. Paraspinal and other soft tissues: Negative. Disc levels: Intervertebral discs are well hydrated without disc height loss, disc desiccation, or focal disc protrusion. T12-L1: Sagittal sequences only. Unremarkable. L1-L2: Sagittal sequences only. Unremarkable. L2-L3: Unremarkable. L3-L4: Unremarkable. L4-L5: Unremarkable. L5-L6: Unremarkable. L6-S1: Unremarkable. IMPRESSION: 1. No acute osseous abnormality. No canal stenosis or evidence of neural impingement of the lumbar spine. 2. Transitional lumbosacral anatomy with 12 rib-bearing thoracic type vertebral segments and 6 non rib-bearing lumbar type vertebral segments. 3. Levo scoliotic lumbar curvature. 4. Diffusely low T1 marrow signal, likely reflecting patient's known sickle cell trait. Electronically Signed   By: Duanne Guess D.O.   On: 03/21/2019 12:51   EEG  adult  Result Date: 03/20/2019 Charlsie Quest, MD     03/20/2019  5:20 PM Patient Name: Richard [name] MRN: [id number] Epilepsy Attending: Charlsie Quest Referring Physician/Provider: Valentina Lucks, NP Date: 03/20/2019 Duration: 23.30 mins Patient history: Richard [name] is an 19 y.o. male  Who presented to [facility] ED after a seizure like episode along with prolonged postictal state. EEG to evaluate for seizure Level of alertness: awake and asleep AEDs during EEG study: ativan Technical aspects: This EEG study was done with scalp electrodes positioned according to the 10-20 International system of electrode placement. Electrical activity was acquired at a sampling rate of 500Hz  and reviewed with a high frequency filter of 70Hz  and a low frequency filter of 1Hz . EEG data were recorded continuously and digitally stored. DESCRIPTION: The posterior dominant rhythm consists of 10 Hz activity of moderate voltage (25-35 uV) seen predominantly  in posterior head regions, symmetric and reactive to eye opening and eye closing. Sleep was characterized by vertex waves, sleep spindles (12-14hz ), maximal frontocentral. No EEG change was seen during hyperventilation. Physiologic photic driving was seen during photic stimulation.   IMPRESSION: This study is within normal limits. No seizures or epileptiform discharges were seen throughout the recording. Priyanka Annabelle Harman    Medications:   . multivitamin with minerals  1 tablet Oral Daily  . senna-docusate  2 tablet Oral BID   Continuous Infusions: . acyclovir 580 mg (03/21/19 0925)     LOS: 0 days   Joseph Art  [facility]   How to contact the [facility] Attending or Consulting provider 7A - 7P or covering provider during after hours 7P -7A, for this patient?  1. Check the care team in [facility] and look for a) attending/consulting [facility] provider listed and b) the [facility] team listed 2. Log into [email or web address] and use [place]'s universal password to access. If you do not have  the password, please contact the hospital operator. 3. Locate the [facility] provider you are looking for under [facility] and page to a number that you can be directly reached. 4. If you still have difficulty reaching the provider, please page the [facility] (Director on Call) for the Hospitalists listed on amion for assistance.  03/21/2019, 3:43 PM

## 2019-03-21 NOTE — Progress Notes (Signed)
NT was helping pt back from the bathroom, and NT called out in the hallway for Nurse. When RN entered the room pt was shaking all over with eyes closed NT placed pt back into the bed and RN went to grab IV ativan. RN saw Dr. Benjamine Mola in the hallway and called her into the room. By the time we entered the room pt had stopped shaking and had opened his eyes. Pt started scratching at his right leg stated that he could not feel his right leg for about 30seconds and the regained feeling. Dr. Wilford Corner paged by Dr. Benjamine Mola and came to bedside to examine pt. Patients blood sugar is 120, Dr. Wilford Corner asked RN to give pt 1mg  of ativan but pt stated that " he did not want to feel sleepy". Pt alert and talking to his friend at bedside. States he does not have any numbness or tingling. Whole event lasted 2 minutes or less.

## 2019-03-22 MED ORDER — ADULT MULTIVITAMIN W/MINERALS CH: 1.0000 | ORAL_TABLET | Freq: Every day | ORAL | Status: DC

## 2019-03-22 MED ORDER — CYANOCOBALAMIN 1000 MCG/ML IJ SOLN: 1000.0000 ug | Freq: Once | INTRAMUSCULAR | Status: DC

## 2019-03-22 NOTE — Plan of Care (Addendum)
Discussed the case with Dr. Benjamine Mola. Seen for seizure activity.  Complained of leg weakness and erectile dysfunction which was acute. Exam with some inconsistencies and and concern for recent stressors and anxiety. No evidence of HSV encephalitis on MRI or CSF evaluation. No evidence for lumbar spinal pathology on MRI. No evidence for Guillain-Barr syndrome on CSF analysis.  Also has intact reflexes.  Impression: -Seizure-like activity -Lower extremity weakness  Recommendations: -No antiepileptics -Seizure precautions including driving restrictions for [facility] law -Follow-up with outpatient neurology -If seizures recur, would require initiation of antiepileptics as well as an EMU admission to further characterize spells -PT OT for leg weakness -Consider antianxiety medications as well as therapy  Neurology will sign off Please call with questions  -- Milon Dikes, MD [facility] Pager: [phone] If 7pm to 7am, please call on call as listed on AMION.

## 2019-03-22 NOTE — Progress Notes (Signed)
Acknowledging TOC consult for PCP. Request submitted to schedule appointment with [facility]. Unable to schedule appointment today d/t weekend.   Hortencia Conradi, RN CCM Transitions of Care [phone]

## 2019-03-22 NOTE — Progress Notes (Signed)
   03/22/19 0938  Pressure Injury Prevention  Positioning Frequency Able to turn self  Mobility  Activity Ambulated in room;Ambulated to bathroom;Ambulated in hall  Range of Motion Active;All extremities  Level of Assistance Contact guard assist, steadying assist  Assistive Device None  Minutes Stood 5 minutes  Distance Ambulated (ft) 200 ft  Bed Position Semi-fowlers    Patient ambulated in hall, standby assist w/ RN. Patient c/o of left leg "signaling" from his toes to his thigh area. Patient states "my left leg, my left leg" as if he is about to fall. As pt sit down bc he thought his "left leg is giving out", RN noted his right leg shaking. Strength 5/5 bilateral lower and upper extremeties assessed. Patient c/o "signaling" from toes to thighs on bil legs while in bed. MD notified.  Sim Boast, RN

## 2019-03-22 NOTE — Discharge Summary (Signed)
Physician Discharge Summary  Richard Mastico Spisak ZOX:[id number] DOB: [birth year] DOA: 03/20/2019  PCP: Patient, No Pcp Per  Admit date: 03/20/2019 Discharge date: 03/22/2019  Admitted From: Home   Discharge disposition: Home   Recommendations for Outpatient Follow-Up:   1. Ambulatory referral to neurology for follow-up 2. Ambulatory referral to psychology for outpatient counseling 3. Seizure precautions including no driving 4. Encourage good sleep hygiene   Discharge Diagnosis:   Active Problems:   Seizure (HCC)   Seizure-like activity (HCC)    Discharge Condition: Improved.  Diet recommendation:  Regular.  Wound care: None.  Code status: Full.   History of Present Illness:   Richard MastRico [name] is a 19 y.o. male with no significant past medical history on no prescribed medication who was [facility] in [facility] ED [facility] from work at [employer] due to seizure like activity.  After arriving to the ED he had another seizure like activity for which he was given IV Ativan push.  Neurology consulted by EDP, Dr. Wilford CornerArora, requested MRI, EEG and LP.  CT head non acute and unremarkable.  EEG showed no evidence of seizure or epileptiform activity.  LP completed.  Neurology recommended starting Acyclovir until HSV encephalitis is ruled out.   Also recommended to hold off AEDs for now.  Patient is able to answer questions.  Had a prior episode of shaking uncontrollably in September 2020, was aware of it but could not stop himself.  Reports bilateral lower extremity weakness associated with tingling for 6 days and similar symptoms in his upper extremities since Sunday, 6 days ago after eating a burger that evening.  He is concerned about inability to have an erection for the past 2 days.  In a monogamous heterosexual relationship.  No urinary or bowel incontinence.  +constipation with [name] stools.  Smoked marijuana 4 days ago provided by same supplier.  No use of alcohol or other illicit drugs.  [facility] asked  to admit.   Hospital Course by Problem:   seizure activity   Exam with some inconsistencies -s/p LP: No evidence of HSV encephalitis on MRI or CSF evaluation. No evidence for lumbar spinal pathology on MRI. No evidence for Guillain-Barr syndrome on CSF analysis.  Also has intact reflexes. -Neurology consult appreciated.  They recommend no antiepileptics -Seizure precautions including [place] [facility] [place] [facility] [place] -Follow-up with outpatient neurology -If seizures recur, would require initiation of antiepileptics as well as an EMU admission to further characterize spells -Outpatient counseling -Patient also sleeps less than 5 hours per night so have counseled on good sleep hygiene Have also discussed not using any illegal drugs such as marijuana  Medical Consultants:   Neurology   Discharge Exam:   Vitals:   03/21/19 2314 03/22/19 0602  BP: 127/67 121/88  Pulse: (!) 52 83  Resp: 18   Temp: 97.8 F (36.6 C) 97.6 F (36.4 C)  SpO2: 100% 100%   Vitals:   03/21/19 1408 03/21/19 1734 03/21/19 2314 03/22/19 0602  BP:  135/80 127/67 121/88  Pulse: 85 62 (!) 52 83  Resp:  16 18   Temp:  98.7 F (37.1 C) 97.8 F (36.6 C) 97.6 F (36.4 C)  TempSrc:  Oral Oral Oral  SpO2:  100% 100% 100%  Weight:      Height:        General exam: Appears calm and comfortable.    The results of significant diagnostics from this hospitalization (including imaging, microbiology, ancillary and laboratory)  are listed below for reference.     Procedures and Diagnostic Studies:   CT Head Wo Contrast  Result Date: 03/20/2019 CLINICAL DATA:  Seizure. Weakness and tingling in left leg for 3 days. EXAM: CT HEAD WITHOUT CONTRAST TECHNIQUE: Contiguous axial images were obtained from the base of the skull through the vertex without intravenous contrast. COMPARISON:  02/16/2018. FINDINGS: Brain: No mass lesion, hemorrhage, hydrocephalus, acute infarct, intra-axial, or extra-axial  fluid collection. Vascular: No hyperdense vessel or unexpected calcification. Skull: No significant soft tissue swelling.  No skull fracture. Sinuses/Orbits: Normal imaged portions of the orbits and globes. Clear paranasal sinuses and mastoid air cells. Other: None. IMPRESSION: Normal head CT. Electronically Signed   By: Jeronimo Greaves M.D.   On: 03/20/2019 11:32   MR BRAIN W WO CONTRAST  Result Date: 03/20/2019 CLINICAL DATA:  Sickle cell trait with witnessed seizure. Weakness and tingling of the left leg. EXAM: MRI HEAD WITHOUT AND WITH CONTRAST TECHNIQUE: Multiplanar, multiecho pulse sequences of the brain and surrounding structures were obtained without and with intravenous contrast. CONTRAST:  5.7mL GADAVIST GADOBUTROL 1 MMOL/ML IV SOLN COMPARISON:  Head CT same day FINDINGS: Brain: The brain has a normal appearance without evidence of malformation, atrophy, old or acute small or large vessel infarction, mass lesion, hemorrhage, hydrocephalus or extra-axial collection. Vascular: Major vessels at the base of the brain show flow. Venous sinuses appear patent. Skull and upper cervical spine: Normal. Sinuses/Orbits: Clear/normal. Other: None significant. IMPRESSION: Normal examination. No evidence of stroke. No abnormality seen to explain seizure. Electronically Signed   By: Paulina Fusi M.D.   On: 03/20/2019 19:18   MR LUMBAR SPINE WO CONTRAST  Result Date: 03/21/2019 CLINICAL DATA:  Low back pain. Bilateral lower extremity weakness. Sickle cell trait EXAM: MRI LUMBAR SPINE WITHOUT CONTRAST TECHNIQUE: Multiplanar, multisequence MR imaging of the lumbar spine was performed. No intravenous contrast was administered. COMPARISON:  CT 02/16/2018 FINDINGS: Segmentation: Transitional anatomy. There are 12 rib-bearing thoracic type vertebral segments and 6 non rib-bearing lumbar type vertebral segments. The inferior most well developed disc space is designated as L6-S1. Alignment: Levo scoliotic lumbar curvature. No  listhesis in the sagittal plane. Vertebrae: No fracture, evidence of discitis, or bone lesion. Intermediate to low T1 signal throughout the visualized osseous structures, likely reflecting patient's known sickle cell trait. Conus medullaris and cauda equina: Conus extends to the L2 level. Conus and cauda equina appear normal. Paraspinal and other soft tissues: Negative. Disc levels: Intervertebral discs are well hydrated without disc height loss, disc desiccation, or focal disc protrusion. T12-L1: Sagittal sequences only. Unremarkable. L1-L2: Sagittal sequences only. Unremarkable. L2-L3: Unremarkable. L3-L4: Unremarkable. L4-L5: Unremarkable. L5-L6: Unremarkable. L6-S1: Unremarkable. IMPRESSION: 1. No acute osseous abnormality. No canal stenosis or evidence of neural impingement of the lumbar spine. 2. Transitional lumbosacral anatomy with 12 rib-bearing thoracic type vertebral segments and 6 non rib-bearing lumbar type vertebral segments. 3. Levo scoliotic lumbar curvature. 4. Diffusely low T1 marrow signal, likely reflecting patient's known sickle cell trait. Electronically Signed   By: Duanne Guess D.O.   On: 03/21/2019 12:51   EEG adult  Result Date: 03/20/2019 Charlsie Quest, MD     03/20/2019  5:20 PM Patient Name: Richard [name] MRN: [id number] Epilepsy Attending: Charlsie Quest Referring Physician/Provider: Valentina Lucks, NP Date: 03/20/2019 Duration: 23.30 mins Patient history: Richard [name] is an 19 y.o. male  Who presented to [facility] ED after a seizure like episode along with prolonged postictal state. EEG to evaluate for seizure Level of alertness:  awake and asleep AEDs during EEG study: ativan Technical aspects: This EEG study was done with scalp electrodes positioned according to the 10-20 International system of electrode placement. Electrical activity was acquired at a sampling rate of 500Hz  and reviewed with a high frequency filter of 70Hz  and a low frequency filter of 1Hz . EEG data were recorded  continuously and digitally stored. DESCRIPTION: The posterior dominant rhythm consists of 10 Hz activity of moderate voltage (25-35 uV) seen predominantly in posterior head regions, symmetric and reactive to eye opening and eye closing. Sleep was characterized by vertex waves, sleep spindles (12-14hz ), maximal frontocentral. No EEG change was seen during hyperventilation. Physiologic photic driving was seen during photic stimulation.   IMPRESSION: This study is within normal limits. No seizures or epileptiform discharges were seen throughout the recording.     Labs:   Basic Metabolic Panel: Recent Labs  Lab 03/18/19 1746 03/20/19 1016 03/21/19 0452 03/21/19 1410  NA 138 139 138 137  K 3.7 4.2 3.6 3.8  CL 102 103 101 104  CO2 26 24 26 26   GLUCOSE 88 89 85 112*  BUN 10 8 9 9   CREATININE 0.79 0.96 0.90 0.94  CALCIUM 9.9 9.9 9.4 9.8   GFR Estimated Creatinine Clearance: 103 mL/min (by C-G formula based on SCr of 0.94 mg/dL). Liver Function Tests: Recent Labs  Lab 03/20/19 1016 03/21/19 0452  AST 28 19  ALT 40 30  ALKPHOS 61 57  BILITOT 1.5* 1.2  PROT 7.4 6.6  ALBUMIN 4.6 4.1   No results for input(s): LIPASE, AMYLASE in the last 168 hours. No results for input(s): AMMONIA in the last 168 hours. Coagulation profile No results for input(s): INR, PROTIME in the last 168 hours.  CBC: Recent Labs  Lab 03/18/19 1746 03/20/19 1016 03/21/19 0452  WBC 7.6 5.8 5.5  NEUTROABS  --  4.2  --   HGB 13.8 13.4 13.4  HCT 38.8* 38.4* 38.0*  MCV 88.0 89.1 87.4  PLT 254 284 277   Cardiac Enzymes: Recent Labs  Lab 03/20/19 1016 03/21/19 1410  CKTOTAL 752* 421*   BNP: Invalid input(s): POCBNP CBG: Recent Labs  Lab 03/18/19 1040 03/18/19 2110 03/21/19 1352  GLUCAP 70  70  70 165* 120*   D-Dimer No results for input(s): DDIMER in the last 72 hours. Hgb A1c No results for input(s): HGBA1C in the last 72 hours. Lipid Profile No results for input(s):  CHOL, HDL, LDLCALC, TRIG, CHOLHDL, LDLDIRECT in the last 72 hours. Thyroid function studies Recent Labs    03/20/19 1805  TSH 0.548   Anemia work up No results for input(s): VITAMINB12, FOLATE, FERRITIN, TIBC, IRON, RETICCTPCT in the last 72 hours. Microbiology Recent Results (from the past 240 hour(s))  Respiratory Panel by RT PCR (Flu A&B, Covid) - Nasopharyngeal Swab     Status: None   Collection Time: 03/20/19 10:33 AM   Specimen: Nasopharyngeal Swab  Result Value Ref Range Status   SARS Coronavirus 2 by RT PCR NEGATIVE NEGATIVE Final    Comment: (NOTE) SARS-CoV-2 target nucleic acids are NOT DETECTED. The SARS-CoV-2 RNA is generally detectable in upper respiratoy specimens during the acute phase of infection. The lowest concentration of SARS-CoV-2 viral copies this assay can detect is 131 copies/mL. A negative result does not preclude SARS-Cov-2 infection and should not be used as the sole basis for treatment or other patient management decisions. A negative result may occur with  improper specimen collection/handling, submission of specimen other than nasopharyngeal  swab, presence of viral mutation(s) within the areas targeted by this assay, and inadequate number of viral copies (<131 copies/mL). A negative result must be combined with clinical observations, patient history, and epidemiological information. The expected result is Negative. Fact Sheet for Patients:  https://www.moore.com/ Fact Sheet for Healthcare Providers:  https://www.young.biz/ This test is not yet ap proved or cleared by the [place] FDA and  has been authorized for detection and/or diagnosis of SARS-CoV-2 by FDA under an Emergency Use Authorization (EUA). This EUA will remain  in effect (meaning this test can be used) for the duration of the COVID-19 declaration under Section 564(b)(1) of the Act, 21 U.S.C. section 360bbb-3(b)(1), unless the authorization is  terminated or revoked sooner.    Influenza A by PCR NEGATIVE NEGATIVE Final   Influenza B by PCR NEGATIVE NEGATIVE Final    Comment: (NOTE) The Xpert Xpress SARS-CoV-2/FLU/RSV assay is intended as an aid in  the diagnosis of influenza from Nasopharyngeal swab specimens and  should not be used as a sole basis for treatment. Nasal washings and  aspirates are unacceptable for Xpert Xpress SARS-CoV-2/FLU/RSV  testing. Fact Sheet for Patients: https://www.moore.com/ Fact Sheet for Healthcare Providers: https://www.young.biz/ This test is not yet approved or cleared by the [place] FDA and  has been authorized for detection and/or diagnosis of SARS-CoV-2 by  FDA under an Emergency Use Authorization (EUA). This EUA will remain  in effect (meaning this test can be used) for the duration of the  Covid-19 declaration under Section 564(b)(1) of the Act, 21  U.S.C. section 360bbb-3(b)(1), unless the authorization is  terminated or revoked. Performed at [facility], [address] [place], [place] [address]   CSF culture     Status: None (Preliminary result)   Collection Time: 03/20/19  3:07 PM   Specimen: CSF; Cerebrospinal Fluid  Result Value Ref Range Status   Specimen Description CSF  Final   Special Requests Normal  Final   Gram Stain CYTOSPIN SMEAR NO WBC SEEN NO ORGANISMS SEEN   Final   Culture   Final    NO GROWTH < 24 HOURS Performed at [facility], [address], [name], [place] [address]    Report Status PENDING  Incomplete  SARS CORONAVIRUS 2 (TAT 6-24 HRS) Nasopharyngeal Nasopharyngeal Swab     Status: None   Collection Time: 03/20/19  5:15 PM   Specimen: Nasopharyngeal Swab  Result Value Ref Range Status   SARS Coronavirus 2 NEGATIVE NEGATIVE Final    Comment: (NOTE) SARS-CoV-2 target nucleic acids are NOT DETECTED. The SARS-CoV-2 RNA is generally detectable in upper and lower respiratory specimens during the  acute phase of infection. Negative results do not preclude SARS-CoV-2 infection, do not rule out co-infections with other pathogens, and should not be used as the sole basis for treatment or other patient management decisions. Negative results must be combined with clinical observations, patient history, and epidemiological information. The expected result is Negative. Fact Sheet for Patients: HairSlick.no Fact Sheet for Healthcare Providers: quierodirigir.com This test is not yet approved or cleared by the [place] FDA and  has been authorized for detection and/or diagnosis of SARS-CoV-2 by FDA under an Emergency Use Authorization (EUA). This EUA will remain  in effect (meaning this test can be used) for the duration of the COVID-19 declaration under Section 56 4(b)(1) of the Act, 21 U.S.C. section 360bbb-3(b)(1), unless the authorization is terminated or revoked sooner. Performed at [facility], [address], [place], [place] [address]      Discharge Instructions:   Discharge Instructions    Ambulatory referral to Neurology   Complete by: As directed    An appointment is requested in approximately: 4 weeks   Ambulatory referral to Psychology   Complete by: As directed    Diet general   Complete by: As directed    Discharge instructions   Complete by: As directed    Per [facility] statutes, patients with seizures are not allowed to drive until they have been seizure-free for six months.   Use caution when using heavy equipment or power tools. Avoid working on ladders or at heights. Take showers instead of baths. Ensure the water temperature is not too high on the home water heater. Do not go swimming alone. Do not lock yourself in a room alone (i.e. bathroom). When caring for infants or small children, sit down when holding, feeding, or changing them to minimize risk of injury to the child in the event  you have a seizure. Maintain good sleep hygiene. Avoid alcohol.   If patienthas another seizure, call 911 and bring them back to the ED if: A. The seizure lasts longer than 5 minutes.  B. The patient doesn't wake shortly after the seizure or has new problems such as difficulty seeing, speaking or moving following the seizure C. The patient was injured during the seizure D. The patient has a temperature over 102 F (39C) E. The patient vomited during the seizure and now is having trouble breathing   Increase activity slowly   Complete by: As directed      Allergies as of 03/22/2019   No Known Allergies     Medication List    STOP taking these medications   ibuprofen 800 MG tablet Commonly known as: ADVIL   ondansetron 4 MG disintegrating tablet Commonly known as: ZOFRAN-ODT     TAKE these medications   multivitamin with minerals Tabs tablet Take 1 tablet by mouth daily. Start taking on: March 23, 2019         Time coordinating discharge: 25 min  Signed:  Geradine Girt DO  [facility] 03/22/2019, 9:00 AM

## 2019-03-22 NOTE — Progress Notes (Signed)
Patient sitting on edge of bed now without complaints, awaiting for transportation.   AVS reviewed without further questions.   Sim Boast, RN

## 2019-03-22 NOTE — Progress Notes (Signed)
Patient verbalized he has a large BM yesterday and declined his senakot at this time.   Sim Boast, RN

## 2019-03-22 NOTE — Discharge Instructions (Signed)
Do not use any illegal drugs (marijuana, etc)

## 2019-03-23 LAB — CSF CULTURE W GRAM STAIN
Culture: NO GROWTH
Special Requests: NORMAL

## 2019-03-23 NOTE — TOC Transition Note (Signed)
Transition of Care [facility]) - CM/SW Discharge Note   Patient Details  Name: Richard [name] MRN: [id number] Date of Birth: 2000/09/03  Transition of Care [name]) CM/SW Contact:  Bess Kinds, RN Phone Number: [phone] 03/23/2019, 10:48 AM   Clinical Narrative:    Spoke with patient on the phone this morning to advise of hospital follow up appointment scheduled at [facility] on 04/08/2019 10:30am. Patient verbalized understanding.          Patient Goals and CMS Choice        Discharge Placement                       Discharge Plan and Services                                     Social Determinants of Health (SDOH) Interventions     Readmission Risk Interventions No flowsheet data found.

## 2019-04-08 ENCOUNTER — Telehealth (INDEPENDENT_AMBULATORY_CARE_PROVIDER_SITE_OTHER): Payer: Self-pay | Admitting: Primary Care

## 2019-05-04 ENCOUNTER — Ambulatory Visit: Payer: MEDICAID | Admitting: Neurology

## 2019-05-04 ENCOUNTER — Encounter: Payer: Self-pay | Admitting: Neurology

## 2019-05-21 ENCOUNTER — Other Ambulatory Visit: Payer: Self-pay

## 2019-05-21 ENCOUNTER — Ambulatory Visit (HOSPITAL_COMMUNITY)
Admission: EM | Admit: 2019-05-21 | Discharge: 2019-05-21 | Disposition: A | Discharge status: Home / Self Care | Payer: Self-pay | Attending: Family Medicine | Admitting: Family Medicine

## 2019-05-21 ENCOUNTER — Encounter (HOSPITAL_COMMUNITY): Payer: Self-pay | Admitting: Emergency Medicine

## 2019-05-21 DIAGNOSIS — Z711 Person with feared health complaint in whom no diagnosis is made: Secondary | ICD-10-CM | POA: Insufficient documentation

## 2019-05-21 DIAGNOSIS — R300 Dysuria: Secondary | ICD-10-CM | POA: Insufficient documentation

## 2019-05-21 DIAGNOSIS — R369 Urethral discharge, unspecified: Secondary | ICD-10-CM

## 2019-05-21 MED ORDER — DOXYCYCLINE HYCLATE 100 MG PO CAPS: 100.0000 mg | ORAL_CAPSULE | Freq: Two times a day (BID) | ORAL | 0 refills | Status: DC

## 2019-05-21 MED ORDER — CEFTRIAXONE SODIUM 500 MG IJ SOLR
INTRAMUSCULAR | Status: AC
  Filled 2019-05-21: qty 500

## 2019-05-21 MED ORDER — CEFTRIAXONE SODIUM 500 MG IJ SOLR
500.0000 mg | Freq: Once | INTRAMUSCULAR | Status: AC
  Administered 2019-05-21: 500 mg via INTRAMUSCULAR

## 2019-05-21 NOTE — ED Triage Notes (Signed)
Pt stats two weeks ago he noticed his testicles were hurting. Also felt some sharp pains inside his urethra. states this morning he used the restroom and when he was urinating, he felt pain. Pt also states he noticed discharged from his penis today. Testicle pain has resolved.

## 2019-05-21 NOTE — Discharge Instructions (Signed)

## 2019-05-21 NOTE — ED Provider Notes (Signed)
[place]   [id number] 05/21/19 Arrival Time: [place] PLAN:  1. Penile discharge   2. Dysuria     Suspicious for gonorrhea given observed penile discharge. No U/A sent secondary to financial concerns. Declines HIV/RPR testing.   Discharge Instructions     You have been given the following today for treatment of suspected gonorrhea and/or chlamydia:  cefTRIAXone (ROCEPHIN) injection 500 mg  Please pick up your prescription for doxycycline 100 mg and begin taking twice daily for the next seven (7) days.  Even though we have treated you today, we have sent testing for sexually transmitted infections. We will notify you of any positive results once they are received. If required, we will prescribe any medications you might need.  Please refrain from all sexual activity for at least the next seven days.      Pending: Labs Reviewed  CYTOLOGY, (ORAL, ANAL, URETHRAL) ANCILLARY ONLY    Will notify of any positive results. Instructed to refrain from sexual activity for at least seven days.  Reviewed expectations re: course of current medical issues. Questions answered. Outlined signs and symptoms indicating need for more acute intervention. Patient verbalized understanding. After Visit Summary given.   SUBJECTIVE:  Richard [name] is a 19 y.o. male who staes that his testicles felt sore approx two weeks ago. This has now resolved. Reports mild dysuria this morning along with noting penile discharge. Describes discharge as thick and opaque and yellow. No specific aggravating or alleviating factors reported. Denies: urinary frequency and gross hematuria. Afebrile. No abdominal or pelvic pain. No n/v. No rashes or lesions. Reports that he is sexually active with single male partner with regular condom use. OTC treatment: none. History of STI: none reported.   OBJECTIVE:  Vitals:   05/21/19 1104  BP: 125/77  Pulse: 62  Resp: 16  Temp: 98.4 F (36.9 C)    SpO2: 100%     General appearance: alert, cooperative, appears stated age and no distress Throat: lips, mucosa, and tongue normal; teeth and gums normal Lungs: unlabored respirations Back: no CVA tenderness; FROM at waist Abdomen: soft, non-tender GU: normal appearing genitalia; thick yellow/green penile discharge Skin: warm and dry Psychological: alert and cooperative; normal mood and affect.    Labs Reviewed  CYTOLOGY, (ORAL, ANAL, URETHRAL) ANCILLARY ONLY    No Known Allergies  Past Medical History:  Diagnosis Date  . Scoliosis   . Seizure ([place]) 03/20/2019   Family History  Problem Relation Age of Onset  . Sickle cell anemia Mother   . CVA Mother        CASUSED BY SICKLE CELL   . Seizures Mother        after cva  . Healthy Father    Social History   Socioeconomic History  . Marital status: Single    Spouse name: Not on file  . Number of children: Not on file  . Years of education: Not on file  . Highest education level: Not on file  Occupational History  . Not on file  Tobacco Use  . Smoking status: Current Every Day Smoker    Packs/day: 0.20    Types: Cigarettes  . Smokeless tobacco: Never Used  . Tobacco comment: 2 cigs per day  Substance and Sexual Activity  . Alcohol use: Never  . Drug use: Never  . Sexual activity: Not on file  Other Topics Concern  . Not on file  Social History Narrative  . Not on file   Social  Determinants of Health   Financial Resource Strain:   . Difficulty of Paying Living Expenses: Not on file  Food Insecurity:   . Worried About Programme researcher, broadcasting/film/video in the Last Year: Not on file  . Ran Out of Food in the Last Year: Not on file  Transportation Needs:   . Lack of Transportation (Medical): Not on file  . Lack of Transportation (Non-Medical): Not on file  Physical Activity:   . Days of Exercise per Week: Not on file  . Minutes of Exercise per Session: Not on file  Stress:   . Feeling of Stress : Not on file  Social  Connections:   . Frequency of Communication with Friends and Family: Not on file  . Frequency of Social Gatherings with Friends and Family: Not on file  . Attends Religious Services: Not on file  . Active Member of Clubs or Organizations: Not on file  . Attends Banker Meetings: Not on file  . Marital Status: Not on file  Intimate Partner Violence:   . Fear of Current or Ex-Partner: Not on file  . Emotionally Abused: Not on file  . Physically Abused: Not on file  . Sexually Abused: Not on file          Mardella Layman, MD 05/21/19 1209

## 2019-05-23 LAB — CYTOLOGY, (ORAL, ANAL, URETHRAL) ANCILLARY ONLY
Chlamydia: POSITIVE — AB
Neisseria Gonorrhea: NEGATIVE
Trichomonas: NEGATIVE

## 2019-05-25 ENCOUNTER — Telehealth (HOSPITAL_COMMUNITY): Payer: Self-pay | Admitting: Emergency Medicine

## 2019-05-25 NOTE — Telephone Encounter (Signed)
Chlamydia is positive.  This was treated at the urgent care visit with prescription doxy.  Please refrain from sexual intercourse for 7 days to give the medicine time to work.  Sexual partners need to be notified and tested/treated.  Condoms may reduce risk of reinfection.  Recheck or followup with PCP for further evaluation if symptoms are not improving.  [facility] notified.  Patient contacted by phone and made aware of    results. Pt verbalized understanding and had all questions answered.

## 2019-06-03 ENCOUNTER — Other Ambulatory Visit: Payer: Self-pay

## 2019-06-03 ENCOUNTER — Ambulatory Visit: Payer: Self-pay | Admitting: Diagnostic Neuroimaging

## 2019-07-14 ENCOUNTER — Emergency Department (HOSPITAL_COMMUNITY)
Admission: EM | Admit: 2019-07-14 | Discharge: 2019-07-14 | Disposition: A | Discharge status: Home / Self Care | Payer: Self-pay | Attending: Emergency Medicine | Admitting: Emergency Medicine

## 2019-07-14 ENCOUNTER — Other Ambulatory Visit: Payer: Self-pay

## 2019-07-14 ENCOUNTER — Encounter (HOSPITAL_COMMUNITY): Payer: Self-pay | Admitting: *Deleted

## 2019-07-14 DIAGNOSIS — Z711 Person with feared health complaint in whom no diagnosis is made: Secondary | ICD-10-CM | POA: Insufficient documentation

## 2019-07-14 DIAGNOSIS — F17210 Nicotine dependence, cigarettes, uncomplicated: Secondary | ICD-10-CM | POA: Insufficient documentation

## 2019-07-14 DIAGNOSIS — K6289 Other specified diseases of anus and rectum: Secondary | ICD-10-CM | POA: Insufficient documentation

## 2019-07-14 LAB — URINALYSIS, ROUTINE W REFLEX MICROSCOPIC
Bilirubin Urine: NEGATIVE
Glucose, UA: NEGATIVE mg/dL
Hgb urine dipstick: NEGATIVE
Ketones, ur: NEGATIVE mg/dL
Leukocytes,Ua: NEGATIVE
Nitrite: NEGATIVE
Protein, ur: NEGATIVE mg/dL
Specific Gravity, Urine: 1.017 (ref 1.005–1.030)
pH: 6 (ref 5.0–8.0)

## 2019-07-14 MED ORDER — STERILE WATER FOR INJECTION IJ SOLN
INTRAMUSCULAR | Status: AC
  Filled 2019-07-14: qty 10

## 2019-07-14 MED ORDER — DOXYCYCLINE HYCLATE 100 MG PO TABS
100.0000 mg | ORAL_TABLET | Freq: Once | ORAL | Status: AC
  Administered 2019-07-14: 23:00:00 100 mg via ORAL
  Filled 2019-07-14: qty 1

## 2019-07-14 MED ORDER — CEFTRIAXONE SODIUM 1 G IJ SOLR
500.0000 mg | Freq: Once | INTRAMUSCULAR | Status: AC
  Administered 2019-07-14: 23:00:00 500 mg via INTRAMUSCULAR
  Filled 2019-07-14: qty 10

## 2019-07-14 NOTE — ED Triage Notes (Signed)
Pt reporting burning at the tip of the penis and around the anus area since last week. Denies penile discharge. No fevers. Treated for chlamydia about a month ago.

## 2019-07-14 NOTE — Discharge Instructions (Signed)
You have been treated today for an STD.   The test results with take 2-3 days to return. If there is an abnormal result, you will be notified. If you do not hear anything, that means the results were negative. You can also log on MyChart to see the results.   Your sexual partner needs to be treated too. Do not have sexual intercourse for the next 7 days and after your partner has been treated.   Follow-up with your primary care doctor in 2-4 days. If you do not have a primary care doctor, you can use one listed in the paperwork.   Return to the Emergency Department for any fever, abdominal pain, difficulty breathing, nausea/vomiting or any other worsening or concerning symptoms.   As we discussed, closely monitor the pain around the anus.  If it starts getting worse, he has redness or swelling or start having fever, return to emergency department.  Otherwise follow-up with [facility] to establish a primary care doctor.

## 2019-07-14 NOTE — ED Provider Notes (Signed)
[facility]-EMERGENCY DEPT Provider Note   CSN: [id number] Arrival date & time: 07/14/19  1919     History Chief Complaint  Patient presents with  . Rectal Pain    Richard [name] is a 19 y.o. male past medical history of scoliosis, seizure who presents for evaluation of burning to the tip of his penis and intermittent sharp, stinging pains to the anus that have been ongoing for about a week.  He states that the sensation to his penis is more of a stinging sensation.  He states that it is worse with urination.  He has not noticed any discharge from the penis or any hematuria.  He has not noticed any overlying warmth, swelling of his testicles.  He has not noticed any overlying rash.  Patient reports that the sharp stinging pain in the anus is there randomly and is not always associated with a bowel movement.  He has not had to strain.  He denies any blood in stools.  No fevers, vomiting.  No history of anal sex.  He is currently sexually active with 1 partner.  They do not use protection.  Patient reports that he was recently treated for chlamydia.  He does report he has had intercourse since then.  The history is provided by the patient.       Past Medical History:  Diagnosis Date  . Scoliosis   . Seizure ([facility]) 03/20/2019    Patient Active Problem List   Diagnosis Date Noted  . Seizure-like activity ([facility]) 03/21/2019  . Seizure ([facility]) 03/20/2019    History reviewed. No pertinent surgical history.     Family History  Problem Relation Age of Onset  . Sickle cell anemia Mother   . CVA Mother        CASUSED BY SICKLE CELL   . Seizures Mother        after cva  . Healthy Father     Social History   Tobacco Use  . Smoking status: Current Every Day Smoker    Packs/day: 0.20    Types: Cigarettes  . Smokeless tobacco: Never Used  . Tobacco comment: 2 cigs per day  Substance Use Topics  . Alcohol use: Never  . Drug use: Never    Home Medications Prior to  Admission medications   Medication Sig Start Date End Date Taking? Authorizing Provider  doxycycline (VIBRAMYCIN) 100 MG capsule Take 1 capsule (100 mg total) by mouth 2 (two) times daily. 05/21/19   Mardella Layman, MD  Multiple Vitamin (MULTIVITAMIN WITH MINERALS) TABS tablet Take 1 tablet by mouth daily. 03/23/19   Joseph Art, DO    Allergies    Patient has no known allergies.  Review of Systems   Review of Systems  Genitourinary: Positive for dysuria and penile pain. Negative for discharge, hematuria, penile swelling, scrotal swelling and testicular pain.       Rectal pain  All other systems reviewed and are negative.   Physical Exam Updated Vital Signs BP (!) 129/99 (BP Location: Right Arm)   Pulse (!) 51   Temp 98.3 F (36.8 C) (Oral)   Resp 16   SpO2 98%   Physical Exam Vitals and nursing note reviewed. Exam conducted with a chaperone present.  Constitutional:      Appearance: He is well-developed.  HENT:     Head: Normocephalic and atraumatic.  Eyes:     General: No scleral icterus.       Right eye: No discharge.  Left eye: No discharge.     Conjunctiva/sclera: Conjunctivae normal.  Pulmonary:     Effort: Pulmonary effort is normal.  Genitourinary:    Penis: Normal and circumcised. No erythema, discharge or lesions.      Testes: Normal.        Right: Tenderness or swelling not present.        Left: Tenderness or swelling not present.     Comments: The exam was performed with a chaperone present. Normal male genitalia. No evidence of rash, ulcers or lesions.  No rashes or lesions noted to the penis.  No active penile discharge.  No overlying warmth, erythema bilateral testicles.  No tenderness noted.  On rectal exam, there is no evidence of anal fissure, hemorrhoid.  On digital rectal exam, no tenderness, no mass. Skin:    General: Skin is warm and dry.  Neurological:     Mental Status: He is alert.  Psychiatric:        Speech: Speech normal.         Behavior: Behavior normal.     ED Results / Procedures / Treatments   Labs (all labs ordered are listed, but only abnormal results are displayed) Labs Reviewed  URINALYSIS, ROUTINE W REFLEX MICROSCOPIC  GC/CHLAMYDIA PROBE AMP ([place]) NOT AT [facility]    EKG None  Radiology No results found.  Procedures Procedures (including critical care time)  Medications Ordered in ED Medications  sterile water (preservative free) injection (  Not Given 07/14/19 2300)  cefTRIAXone (ROCEPHIN) injection 500 mg (500 mg Intramuscular Given 07/14/19 2257)  doxycycline (VIBRA-TABS) tablet 100 mg (100 mg Oral Given 07/14/19 2258)    ED Course  I have reviewed the triage vital signs and the nursing notes.  Pertinent labs & imaging results that were available during my care of the patient were reviewed by me and considered in my medical decision making (see chart for details).    MDM Rules/Calculators/A&P                      19 year old male who presents for evaluation of rectal pain and pain to the tip of his penis that has been ongoing for about a week.  He is currently sexually active with 1 partner.  They do not use protection.  Recently treated for chlamydia.  Has had intercourse since then.  States he has had some dysuria but denies any penile discharge, hematuria.  No fevers.  Initially arrival, he is afebrile, nontoxic-appearing.  Vital signs are stable.  Plan to check labs, GC/committee.  On exam, no evidence of penile discharge, rash, lesions.  No testicular abnormalities.  On rectal exam, no evidence of anal fissures, hemorrhoids.  He is able to tolerate digital rectal exam without any tenderness.  Do not suspect perirectal abscess.   I discussed treatment options with patient regarding his pain in his penis.  We did discuss treatment for possible STDs.  Patient is agreeable.  Patient with no known drug allergies.  At this time, overall unclear etiology of his rectal pain.  He does have  atypical features as he states it is more like a stinging pain that occurs randomly.  His exam is not consistent with abscess.  Do not see any sign that would be indicative of herpes.  Instructed patient that he will need monitor symptoms closely.  We will give him outpatient referral to [facility] illness clinic for primary care establishment. At this time, patient exhibits no emergent life-threatening condition  that require further evaluation in ED or admission. Patient had ample opportunity for questions and discussion. All patient's questions were answered with full understanding. Strict return precautions discussed. Patient expresses understanding and agreement to plan.   Portions of this note were generated with Lobbyist. Dictation errors may occur despite best attempts at proofreading.   Final Clinical Impression(s) / ED Diagnoses Final diagnoses:  Rectal pain  Concern about STD in male without diagnosis    Rx / DC Orders ED Discharge Orders    None       Desma Mcgregor 07/14/19 2329    Carmin Muskrat, MD 07/15/19 [phone]

## 2019-07-15 LAB — GC/CHLAMYDIA PROBE AMP (~~LOC~~) NOT AT ARMC
Chlamydia: NEGATIVE
Comment: NEGATIVE
Comment: NORMAL
Neisseria Gonorrhea: NEGATIVE

## 2019-07-17 ENCOUNTER — Other Ambulatory Visit: Payer: Self-pay

## 2019-07-17 ENCOUNTER — Ambulatory Visit (HOSPITAL_COMMUNITY)
Admission: EM | Admit: 2019-07-17 | Discharge: 2019-07-17 | Disposition: A | Discharge status: Home / Self Care | Payer: Self-pay | Attending: Family Medicine | Admitting: Family Medicine

## 2019-07-17 ENCOUNTER — Encounter (HOSPITAL_COMMUNITY): Payer: Self-pay

## 2019-07-17 DIAGNOSIS — H938X3 Other specified disorders of ear, bilateral: Secondary | ICD-10-CM

## 2019-07-17 DIAGNOSIS — R1084 Generalized abdominal pain: Secondary | ICD-10-CM

## 2019-07-17 MED ORDER — GABAPENTIN 100 MG PO CAPS: 100.0000 mg | ORAL_CAPSULE | Freq: Three times a day (TID) | ORAL | 0 refills | Status: DC

## 2019-07-17 NOTE — ED Provider Notes (Signed)
[facility]    CSN: [id number] Arrival date & time: 07/17/19  1719      History   Chief Complaint Chief Complaint  Patient presents with  . Abdominal Pain    HPI Richard [name] is a 19 y.o. male.   HPI  Patient is here with multiple unusual complaints. He complains of a needle sticking pricking sensation in his penis, anus, and ears for the last 2 weeks. He has been seen in the emergency department He went to a different emergency department in [place] He also states he is developing some upper abdominal pain that he also describes as a "stinging" He denies any pain No change in appetite.  No nausea vomiting.  No loss of weight. He did have an ER visit for seizure activity, all tests were negative, was advised to see a counselor.  I suspect patient has anxiety. I have reviewed all the patient's lab work, imaging, and medical visits for the last 6 months Patient denies distress.  He denies anxiety. He did state that the stinging in his penis started after he was diagnosed with chlamydia.  Past Medical History:  Diagnosis Date  . Scoliosis   . Seizure ([facility]) 03/20/2019    Patient Active Problem List   Diagnosis Date Noted  . Seizure-like activity ([facility]) 03/21/2019  . Seizure ([facility]) 03/20/2019    History reviewed. No pertinent surgical history.     Home Medications    Prior to Admission medications   Medication Sig Start Date End Date Taking? Authorizing Provider  gabapentin (NEURONTIN) 100 MG capsule Take 1 capsule (100 mg total) by mouth 3 (three) times daily. For anxiety or nerve pain 07/17/19   Eustace Moore, MD  Multiple Vitamin (MULTIVITAMIN WITH MINERALS) TABS tablet Take 1 tablet by mouth daily. 03/23/19   Joseph Art, DO    Family History Family History  Problem Relation Age of Onset  . Sickle cell anemia Mother   . CVA Mother        CASUSED BY SICKLE CELL   . Seizures Mother        after cva  . Healthy Father     Social  History Social History   Tobacco Use  . Smoking status: Current Every Day Smoker    Packs/day: 0.20    Types: Cigarettes  . Smokeless tobacco: Never Used  . Tobacco comment: 2 cigs per day  Substance Use Topics  . Alcohol use: Never  . Drug use: Never     Allergies   Patient has no known allergies.   Review of Systems Review of Systems  Constitutional: Negative for chills and fever.  HENT: Negative for congestion, ear pain, hearing loss, rhinorrhea and sore throat.   Gastrointestinal: Negative for anal bleeding.  Genitourinary: Negative for discharge, penile pain and penile swelling.   See HPI   Physical Exam Triage Vital Signs ED Triage Vitals  Enc Vitals Group     BP 07/17/19 1826 136/83     Pulse Rate 07/17/19 1826 78     Resp 07/17/19 1826 16     Temp 07/17/19 1826 98.7 F (37.1 C)     Temp Source 07/17/19 1826 Oral     SpO2 07/17/19 1826 98 %     Weight --      Height --      Head Circumference --      Peak Flow --      Pain Score 07/17/19 1833 0     Pain  Loc --      Pain Edu? --      Excl. in GC? --    No data found.  Updated Vital Signs BP 136/83 (BP Location: Right Arm)   Pulse 78   Temp 98.7 F (37.1 C) (Oral)   Resp 16   SpO2 98%      Physical Exam Constitutional:      General: He is not in acute distress.    Appearance: He is well-developed.     Comments: No acute distress  HENT:     Head: Normocephalic and atraumatic.     Right Ear: Tympanic membrane and ear canal normal.     Left Ear: Tympanic membrane and ear canal normal.     Nose: Nose normal.     Mouth/Throat:     Mouth: Mucous membranes are moist.     Pharynx: No posterior oropharyngeal erythema.  Eyes:     Conjunctiva/sclera: Conjunctivae normal.     Pupils: Pupils are equal, round, and reactive to light.  Cardiovascular:     Rate and Rhythm: Normal rate and regular rhythm.     Heart sounds: Normal heart sounds.  Pulmonary:     Effort: Pulmonary effort is normal. No  respiratory distress.     Breath sounds: Normal breath sounds.  Abdominal:     General: There is no distension.     Palpations: Abdomen is soft.     Comments: No abdominal tenderness.  No organomegaly  Genitourinary:    Comments: Deferred.  Examined in the ER 4/27 Musculoskeletal:        General: Normal range of motion.     Cervical back: Normal range of motion.  Lymphadenopathy:     Cervical: No cervical adenopathy.  Skin:    General: Skin is warm and dry.  Neurological:     General: No focal deficit present.     Mental Status: He is alert.  Psychiatric:        Mood and Affect: Mood normal.        Behavior: Behavior normal.      UC Treatments / Results  Labs (all labs ordered are listed, but only abnormal results are displayed) Labs Reviewed - No data to display  EKG   Radiology No results found.  Procedures Procedures (including critical care time)  Medications Ordered in UC Medications - No data to display  Initial Impression / Assessment and Plan / UC Course  I have reviewed the triage vital signs and the nursing notes.  Pertinent labs & imaging results that were available during my care of the patient were reviewed by me and considered in my medical decision making (see chart for details).      Discussed with the patient that I think anxiety is a component of his symptoms.  He never did go to neurology because he lacks insurance. I asked him to consider financial aid forms and going to the health and wellness clinic f Final Clinical Impressions(s) / UC Diagnoses   Final diagnoses:  Generalized abdominal pain  Abnormal sensation in both ears     Discharge Instructions     Call on Monday to set up an appointment with the community health center Take the gabapentin for anxiety or nerve pain Continue to eat well and try to get enough sleep and exercise   ED Prescriptions    Medication Sig Dispense Auth. Provider   gabapentin (NEURONTIN) 100 MG  capsule Take 1 capsule (100 mg total) by mouth 3 (  three) times daily. For anxiety or nerve pain 30 capsule Raylene Everts, MD     PDMP not reviewed this encounter.   Raylene Everts, MD 07/17/19 [phone]

## 2019-07-17 NOTE — Discharge Instructions (Signed)
Call on Monday to set up an appointment with the community health center Take the gabapentin for anxiety or nerve pain Continue to eat well and try to get enough sleep and exercise

## 2019-07-17 NOTE — ED Triage Notes (Signed)
Pt reports he feels like needle sticks in his ears, penis and anus x 2 weeks; abdominal pain after eating x 2 weeks. Pt denies any burning sensation when urinating or penile discharge. Pt reports he had an testicular ultrasound  3 weeks ago and the find some fluid.

## 2019-07-20 MED FILL — GABAPENTIN 100 MG CAPSULE: 100 | 10 days supply | Qty: 30 | Fill #0

## 2019-09-19 ENCOUNTER — Encounter (HOSPITAL_COMMUNITY): Payer: Self-pay

## 2019-09-19 ENCOUNTER — Ambulatory Visit (HOSPITAL_COMMUNITY)
Admission: EM | Admit: 2019-09-19 | Discharge: 2019-09-19 | Disposition: A | Discharge status: Home / Self Care | Payer: Self-pay | Attending: Urgent Care | Admitting: Urgent Care

## 2019-09-19 DIAGNOSIS — K644 Residual hemorrhoidal skin tags: Secondary | ICD-10-CM

## 2019-09-19 DIAGNOSIS — K6289 Other specified diseases of anus and rectum: Secondary | ICD-10-CM

## 2019-09-19 DIAGNOSIS — K5900 Constipation, unspecified: Secondary | ICD-10-CM

## 2019-09-19 DIAGNOSIS — K5909 Other constipation: Secondary | ICD-10-CM

## 2019-09-19 MED ORDER — DOCUSATE SODIUM 100 MG PO CAPS: 100.0000 mg | ORAL_CAPSULE | Freq: Two times a day (BID) | ORAL | 0 refills | Status: DC | PRN

## 2019-09-19 MED ORDER — FLEET ENEMA 7-19 GM/118ML RE ENEM: 1.0000 | ENEMA | Freq: Every day | RECTAL | 0 refills | Status: DC | PRN

## 2019-09-19 NOTE — ED Triage Notes (Signed)
Pt reports he was constipated 5 days ago and took laxatives and had loose stools x 2 days. States he is currently constipated after he stopped taking the laxatives.

## 2019-09-19 NOTE — ED Provider Notes (Signed)
[facility]   MRN: [id number] DOB: 2000-12-01  Subjective:   Richard [name] is a 19 y.o. male presenting for 5-day history of recurrent constipation.  Patient took multiple laxatives and then started having loose stools for 2 days.  He is again struggling with constipation.  Patient reports longstanding history of this since he was a child.  Mainly he tries to eat fiber, fruits and vegetables.  However from time to time he has restaurant food and this can be very problematic for his constipation.  Has never had a consult with the gastroenterologist.  Does not have a PCP.  Denies fever, nausea, vomiting.  He has had intermittent pain over the anal area, felt masses but are no longer there.  No current facility-administered medications for this encounter.  Current Outpatient Medications:    gabapentin (NEURONTIN) 100 MG capsule, Take 1 capsule (100 mg total) by mouth 3 (three) times daily. For anxiety or nerve pain, Disp: 30 capsule, Rfl: 0   Multiple Vitamin (MULTIVITAMIN WITH MINERALS) TABS tablet, Take 1 tablet by mouth daily., Disp:  , Rfl:    No Known Allergies  Past Medical History:  Diagnosis Date   Scoliosis    Seizure ([facility]) 03/20/2019     History reviewed. No pertinent surgical history.  Family History  Problem Relation Age of Onset   Sickle cell anemia Mother    CVA Mother        CASUSED BY SICKLE CELL    Seizures Mother        after cva   Healthy Father     Social History   Tobacco Use   Smoking status: Current Every Day Smoker    Packs/day: 0.20    Types: Cigarettes   Smokeless tobacco: Never Used   Tobacco comment: 2 cigs per day  Vaping Use   Vaping Use: Never used  Substance Use Topics   Alcohol use: Never   Drug use: Never    ROS   Objective:   Vitals: BP 125/79 (BP Location: Right Arm)    Pulse (!) 59    Temp 98.5 F (36.9 C) (Oral)    Resp 16    SpO2 100%   Physical Exam Constitutional:      General: He is not in acute  distress.    Appearance: Normal appearance. He is well-developed and normal weight. He is not ill-appearing, toxic-appearing or diaphoretic.  HENT:     Head: Normocephalic and atraumatic.     Right Ear: External ear normal.     Left Ear: External ear normal.     Nose: Nose normal.     Mouth/Throat:     Pharynx: Oropharynx is clear.  Eyes:     General: No scleral icterus.       Right eye: No discharge.        Left eye: No discharge.     Extraocular Movements: Extraocular movements intact.     Pupils: Pupils are equal, round, and reactive to light.  Cardiovascular:     Rate and Rhythm: Normal rate.  Pulmonary:     Effort: Pulmonary effort is normal.  Genitourinary:    Rectum: External hemorrhoid (Hemorrhoidal skin tags over areas outlined) present. No tenderness or anal fissure.    Musculoskeletal:     Cervical back: Normal range of motion.  Skin:    General: Skin is warm and dry.  Neurological:     Mental Status: He is alert and oriented to person, place, and time.  Psychiatric:  Mood and Affect: Mood normal.        Behavior: Behavior normal.        Thought Content: Thought content normal.        Judgment: Judgment normal.     Assessment and Plan :   PDMP not reviewed this encounter.  1. Anal pain   2. Constipation, unspecified constipation type   3. Chronic constipation   4. External hemorrhoids     Counseled patient on general management of constipation.  Recommended consultation with gastroenterology, referral provided.  For now, hold off on using any laxatives, use an enema instead.  Get started with docusate stool softener.  Vital signs are stable for discharge. Counseled patient on potential for adverse effects with medications prescribed/recommended today, ER and return-to-clinic precautions discussed, patient verbalized understanding.    Wallis Bamberg, PA-C 09/19/19 1708

## 2019-09-20 ENCOUNTER — Other Ambulatory Visit: Payer: Self-pay

## 2019-09-20 ENCOUNTER — Encounter (HOSPITAL_COMMUNITY): Payer: Self-pay | Admitting: Emergency Medicine

## 2019-09-20 ENCOUNTER — Emergency Department (HOSPITAL_COMMUNITY)
Admission: EM | Admit: 2019-09-20 | Discharge: 2019-09-20 | Disposition: A | Discharge status: Home / Self Care | Payer: Self-pay | Attending: Emergency Medicine | Admitting: Emergency Medicine

## 2019-09-20 DIAGNOSIS — Z79899 Other long term (current) drug therapy: Secondary | ICD-10-CM | POA: Insufficient documentation

## 2019-09-20 DIAGNOSIS — F17210 Nicotine dependence, cigarettes, uncomplicated: Secondary | ICD-10-CM | POA: Insufficient documentation

## 2019-09-20 DIAGNOSIS — K5900 Constipation, unspecified: Secondary | ICD-10-CM | POA: Insufficient documentation

## 2019-09-20 MED ORDER — POLYETHYLENE GLYCOL 3350 17 GM/SCOOP PO POWD: 1.0000 | Freq: Once | ORAL | 0 refills | Status: AC

## 2019-09-20 NOTE — Discharge Instructions (Signed)

## 2019-09-20 NOTE — ED Provider Notes (Signed)
[facility] EMERGENCY DEPARTMENT Provider Note   CSN: [id number] Arrival date & time: 09/20/19  1213     History Chief Complaint  Patient presents with   Constipation    Ravindra Baranek is a 19 y.o. male.  HPI Patient is a 19 year old male with a history of constipation presenting today with same.  He was seen yesterday in urgent care for constipation.  He states he has taken no laxatives since yesterday.  He states that he has had chronic constipation since he was a child.  He states that he does have to strain when pooping.  Patient denies any abdominal pain.  No nausea vomiting or diarrhea.  He does state that he has diarrhea when he takes mag citrate.  He has not been taking any stool softener or fiber.  He states he is often dehydrated and does not eat any vegetables.  Patient states that he did look at his anus with his camera on his phone and noticed a bump and was concerned for this.     Past Medical History:  Diagnosis Date   Scoliosis    Seizure (HCC) 03/20/2019    Patient Active Problem List   Diagnosis Date Noted   Seizure-like activity (HCC) 03/21/2019   Seizure (HCC) 03/20/2019    History reviewed. No pertinent surgical history.     Family History  Problem Relation Age of Onset   Sickle cell anemia Mother    CVA Mother        CASUSED BY SICKLE CELL    Seizures Mother        after cva   Healthy Father     Social History   Tobacco Use   Smoking status: Current Every Day Smoker    Packs/day: 0.20    Types: Cigarettes   Smokeless tobacco: Never Used   Tobacco comment: 2 cigs per day  Vaping Use   Vaping Use: Never used  Substance Use Topics   Alcohol use: Never   Drug use: Never    Home Medications Prior to Admission medications   Medication Sig Start Date End Date Taking? Authorizing Provider  docusate sodium (COLACE) 100 MG capsule Take 1 capsule (100 mg total) by mouth 2 (two) times daily as needed for moderate  constipation. 09/19/19   Wallis Bamberg, PA-C  gabapentin (NEURONTIN) 100 MG capsule Take 1 capsule (100 mg total) by mouth 3 (three) times daily. For anxiety or nerve pain 07/17/19   Eustace Moore, MD  Multiple Vitamin (MULTIVITAMIN WITH MINERALS) TABS tablet Take 1 tablet by mouth daily. 03/23/19   Joseph Art, DO  polyethylene glycol powder (GLYCOLAX/MIRALAX) 17 GM/SCOOP powder Take 255 g by mouth once for 1 dose. Please use up to 3 times daily.  You may start with 1 capful per dose and titrate up until he have loose stool. 09/20/19 09/20/19  Gailen Shelter, PA  sodium phosphate (FLEET) 7-19 GM/118ML ENEM Place 133 mLs (1 enema total) rectally daily as needed for severe constipation. 09/19/19   Wallis Bamberg, PA-C    Allergies    Patient has no known allergies.  Review of Systems   Review of Systems  Constitutional: Negative for chills, fatigue and fever.  HENT: Negative for congestion.   Eyes: Negative for pain.  Respiratory: Negative for cough and shortness of breath.   Cardiovascular: Negative for chest pain and leg swelling.  Gastrointestinal: Positive for constipation. Negative for abdominal pain, diarrhea, nausea and vomiting.  Anal pain  Genitourinary: Negative for dysuria.  Musculoskeletal: Negative for myalgias.  Skin: Negative for rash.  Neurological: Negative for dizziness and headaches.    Physical Exam Updated Vital Signs BP 127/71 (BP Location: Right Arm)    Pulse 87    Temp 99 F (37.2 C) (Oral)    Resp 18    SpO2 98%   Physical Exam Vitals and nursing note reviewed. Exam conducted with a chaperone present.  Constitutional:      General: He is not in acute distress. HENT:     Head: Normocephalic and atraumatic.     Nose: Nose normal.     Mouth/Throat:     Mouth: Mucous membranes are moist.  Eyes:     General: No scleral icterus. Cardiovascular:     Rate and Rhythm: Normal rate and regular rhythm.     Pulses: Normal pulses.     Heart sounds: Normal heart  sounds.  Pulmonary:     Effort: Pulmonary effort is normal. No respiratory distress.     Breath sounds: No wheezing.  Abdominal:     Palpations: Abdomen is soft.     Tenderness: There is no abdominal tenderness. There is no guarding or rebound.  Genitourinary:    Comments: Single small nonthrombosed hemorrhoid present.  No bleeding.  Rectal vault is empty.  No palpable masses.  Mellody Dance EMT present for exam Musculoskeletal:     Cervical back: Normal range of motion.     Right lower leg: No edema.     Left lower leg: No edema.  Skin:    General: Skin is warm and dry.     Capillary Refill: Capillary refill takes less than 2 seconds.  Neurological:     Mental Status: He is alert. Mental status is at baseline.  Psychiatric:        Mood and Affect: Mood normal.        Behavior: Behavior normal.     ED Results / Procedures / Treatments   Labs (all labs ordered are listed, but only abnormal results are displayed) Labs Reviewed - No data to display  EKG None  Radiology No results found.  Procedures Procedures (including critical care time)  Medications Ordered in ED Medications - No data to display  ED Course  I have reviewed the triage vital signs and the nursing notes.  Pertinent labs & imaging results that were available during my care of the patient were reviewed by me and considered in my medical decision making (see chart for details).    MDM Rules/Calculators/A&P                           Patient is 19 year old male with history of constipation presented today with concern for abnormal bump on anus.  He has some mild pain but no significant pain no fevers chills or swelling.  Patient has one small nonthrombosed hemorrhoid on exam.  He is complaining of constipation.  Will provide patient with bowel regimen including MiraLAX.  He will titrate to soft stool.  Increase fluid intake and fiber and exercise.  Physical exam is unremarkable apart from the hemorrhoid.  He has  no other complaints at this time.  Will discharge with plan as given.   Final Clinical Impression(s) / ED Diagnoses Final diagnoses:  Constipation, unspecified constipation type    Rx / DC Orders ED Discharge Orders         Ordered    polyethylene glycol  powder (GLYCOLAX/MIRALAX) 17 GM/SCOOP powder   Once     Discontinue  Reprint     09/20/19 1445           Gailen Shelter, [place] 09/20/19 1836    Pricilla Loveless, MD 09/20/19 2220

## 2019-09-20 NOTE — ED Triage Notes (Signed)
Pt reports constipation since Monday.  Took laxatives with some relief.  States he has had pain at rectum and feels that he has a blockage at rectum.  Denies pain at present.  Seen at [facility] yesterday for constipation.

## 2019-10-18 ENCOUNTER — Ambulatory Visit (HOSPITAL_COMMUNITY)
Admission: EM | Admit: 2019-10-18 | Discharge: 2019-10-18 | Disposition: A | Discharge status: Home / Self Care | Payer: Self-pay | Attending: Physician Assistant | Admitting: Physician Assistant

## 2019-10-18 ENCOUNTER — Other Ambulatory Visit: Payer: Self-pay

## 2019-10-18 ENCOUNTER — Encounter (HOSPITAL_COMMUNITY): Payer: Self-pay

## 2019-10-18 DIAGNOSIS — Q386 Other congenital malformations of mouth: Secondary | ICD-10-CM

## 2019-10-18 DIAGNOSIS — R109 Unspecified abdominal pain: Secondary | ICD-10-CM

## 2019-10-18 DIAGNOSIS — N368 Other specified disorders of urethra: Secondary | ICD-10-CM

## 2019-10-18 DIAGNOSIS — R21 Rash and other nonspecific skin eruption: Secondary | ICD-10-CM

## 2019-10-18 DIAGNOSIS — N50819 Testicular pain, unspecified: Secondary | ICD-10-CM

## 2019-10-18 LAB — POCT URINALYSIS DIP (DEVICE)
Bilirubin Urine: NEGATIVE
Glucose, UA: NEGATIVE mg/dL
Hgb urine dipstick: NEGATIVE
Ketones, ur: NEGATIVE mg/dL
Leukocytes,Ua: NEGATIVE
Nitrite: NEGATIVE
Protein, ur: NEGATIVE mg/dL
Specific Gravity, Urine: 1.025 (ref 1.005–1.030)
Urobilinogen, UA: 1 mg/dL (ref 0.0–1.0)
pH: 7 (ref 5.0–8.0)

## 2019-10-18 LAB — HIV ANTIBODY (ROUTINE TESTING W REFLEX): HIV Screen 4th Generation wRfx: NONREACTIVE

## 2019-10-18 NOTE — ED Provider Notes (Signed)
[facility]    CSN: [id number] Arrival date & time: 10/18/19  1534      History   Chief Complaint Chief Complaint  Patient presents with  . Abdominal Pain  . penile rash    HPI Richard [name] is a 19 y.o. male.   Patient reports for urinary issues and lower abdominal complaints.  He reports over the last several months he has noticed some stinging inside his urethra and" stinging sensation" in his left lower abdomen.  He denies any discharge.  He reports intermittent testicular pain for quite some time as well, at least a few months.  He reports he will have sudden pain in the testicles and this would go away.  Pain would last  Few moments. He reports he was evaluated in this in [place] a few months ago and had an ultrasound that did not show anything wrong.  He reports the testicles  intermittently swell and then go back down.  He reports an episode of pain earlier today but denies it in clinic currently.  Has not any fevers or chills.  No frequency of urination or urgency.  Sexually active with one partner.  He would like STI testing.  He is also concerned about a rash he noticed side of his penis.  He notices few days ago.  It is neither painful or itchy.  Patient reports he vomited last weekend and was little nauseous.  He has not had symptoms since then.  Had negative Covid test with work.     Past Medical History:  Diagnosis Date  . Scoliosis   . Seizure (HCC) 03/20/2019    Patient Active Problem List   Diagnosis Date Noted  . Seizure-like activity (HCC) 03/21/2019  . Seizure (HCC) 03/20/2019    History reviewed. No pertinent surgical history.     Home Medications    Prior to Admission medications   Medication Sig Start Date End Date Taking? Authorizing Provider  docusate sodium (COLACE) 100 MG capsule Take 1 capsule (100 mg total) by mouth 2 (two) times daily as needed for moderate constipation. 09/19/19   Wallis Bamberg, PA-C  gabapentin (NEURONTIN) 100 MG  capsule Take 1 capsule (100 mg total) by mouth 3 (three) times daily. For anxiety or nerve pain 07/17/19   Eustace Moore, MD  Multiple Vitamin (MULTIVITAMIN WITH MINERALS) TABS tablet Take 1 tablet by mouth daily. 03/23/19   Joseph Art, DO  sodium phosphate (FLEET) 7-19 GM/118ML ENEM Place 133 mLs (1 enema total) rectally daily as needed for severe constipation. 09/19/19   Wallis Bamberg, PA-C    Family History Family History  Problem Relation Age of Onset  . Sickle cell anemia Mother   . CVA Mother        CASUSED BY SICKLE CELL   . Seizures Mother        after cva  . Healthy Father     Social History Social History   Tobacco Use  . Smoking status: Current Every Day Smoker    Packs/day: 0.20    Types: Cigarettes  . Smokeless tobacco: Never Used  . Tobacco comment: 2 cigs per day  Vaping Use  . Vaping Use: Never used  Substance Use Topics  . Alcohol use: Never  . Drug use: Never     Allergies   Patient has no known allergies.   Review of Systems Review of Systems   Physical Exam Triage Vital Signs ED Triage Vitals  Enc Vitals Group  BP 10/18/19 1632 (!) 147/99     Pulse Rate 10/18/19 1632 76     Resp 10/18/19 1632 18     Temp 10/18/19 1632 99.2 F (37.3 C)     Temp Source 10/18/19 1632 Oral     SpO2 10/18/19 1632 100 %     Weight --      Height --      Head Circumference --      Peak Flow --      Pain Score 10/18/19 1633 0     Pain Loc --      Pain Edu? --      Excl. in GC? --    No data found.  Updated Vital Signs BP (!) 147/99 (BP Location: Left Arm)   Pulse 76   Temp 99.2 F (37.3 C) (Oral)   Resp 18   SpO2 100%   Visual Acuity Right Eye Distance:   Left Eye Distance:   Bilateral Distance:    Right Eye Near:   Left Eye Near:    Bilateral Near:     Physical Exam Vitals and nursing note reviewed.  Constitutional:      General: He is not in acute distress.    Appearance: He is well-developed. He is not ill-appearing.  HENT:      Head: Normocephalic and atraumatic.  Eyes:     Conjunctiva/sclera: Conjunctivae normal.  Cardiovascular:     Rate and Rhythm: Normal rate.  Pulmonary:     Effort: Pulmonary effort is normal.  Abdominal:     Palpations: Abdomen is soft.     Tenderness: There is no abdominal tenderness.  Genitourinary:    Testes:        Right: Mass, tenderness or swelling not present.        Left: Mass, tenderness or swelling not present.     Comments: Left testicle slightly smaller than the right.  No epididymal tenderness or swelling. Non-ttp. Equal lie. Cremasteric present  No urethral discharge.  There are Fordyce spots on the left shaft of the penis.  No inguinal adenopathy. Musculoskeletal:     Cervical back: Neck supple.  Skin:    General: Skin is warm and dry.  Neurological:     Mental Status: He is alert.      UC Treatments / Results  Labs (all labs ordered are listed, but only abnormal results are displayed) Labs Reviewed  HIV ANTIBODY (ROUTINE TESTING W REFLEX)  RPR  POCT URINALYSIS DIP (DEVICE)  CYTOLOGY, (ORAL, ANAL, URETHRAL) ANCILLARY ONLY    EKG   Radiology No results found.  Procedures Procedures (including critical care time)  Medications Ordered in UC Medications - No data to display  Initial Impression / Assessment and Plan / UC Course  I have reviewed the triage vital signs and the nursing notes.  Pertinent labs & imaging results that were available during my care of the patient were reviewed by me and considered in my medical decision making (see chart for details).    #Urethral irritation #Chronic testicular pains #Fordyce spots Patient is a 19 year old who presents with concerns of chronic urethral irritation and pain in his testicles.  He has had similar presentations with negative testing.  Last positive testing March 2021, with negative testing after this.  Given no active pain and clinic right now the testicles and reassuring exam, do not feel this  is an emergent issue.  Urethral swab sent.  UA without sign of infection.  Patient requested HIV and syphilis  testing.  Lesions on penis are more consistent with Fordyce spots, low suspicion for HSV or HPV.  Abdominal pain is nonspecific.  Patient does have a history of nonspecific seemingly neuropathic pains.  Highly recommended the patient establish with a primary care also recommended he follow-up with a urologist given his chronic symptoms.  Discussed strict emergency department precautions for worsening testicular pains or persistent testicular pains.  Patient verbalized understanding plan of care. Final Clinical Impressions(s) / UC Diagnoses   Final diagnoses:  Urethral irritation  Pain in testicle, unspecified laterality  Fordyce spots     Discharge Instructions     We have sent your tests, we will call if we need to treat, otherwise results will be in your mychart  Establish with a primary care, call the family medicine center first  You should also consider seeing a urologist for follow up on your chronic urologic concerns, call the number given  If severe pain, go to the Emergency department     ED Prescriptions    None     PDMP not reviewed this encounter.   Hermelinda Medicus, PA-C 10/18/19 2139

## 2019-10-18 NOTE — ED Triage Notes (Signed)
Pt c/o nausea, suprapubic pain, urethral pain (stinging), "white bumps" on penis, testicular pain for intermittently for past several months, worsening the past week. Pt states vomiting earlier this week. Had negative COVID test yesterday. Pt states he has been evaluated for similar symptoms and tx for chlamydia in past, and given Rx gabapentin for "nerve pain" which pt never filled/picked up from pharmacy. Denies fever, diarrhea.

## 2019-10-18 NOTE — Discharge Instructions (Signed)
We have sent your tests, we will call if we need to treat, otherwise results will be in your mychart  Establish with a primary care, call the family medicine center first  You should also consider seeing a urologist for follow up on your chronic urologic concerns, call the number given  If severe pain, go to the Emergency department

## 2019-10-19 LAB — CYTOLOGY, (ORAL, ANAL, URETHRAL) ANCILLARY ONLY
Chlamydia: NEGATIVE
Comment: NEGATIVE
Comment: NEGATIVE
Comment: NORMAL
Neisseria Gonorrhea: NEGATIVE
Trichomonas: NEGATIVE

## 2019-10-19 LAB — RPR: RPR Ser Ql: NONREACTIVE

## 2019-11-05 ENCOUNTER — Encounter (HOSPITAL_COMMUNITY): Payer: Self-pay

## 2019-11-05 ENCOUNTER — Ambulatory Visit (HOSPITAL_COMMUNITY)
Admission: EM | Admit: 2019-11-05 | Discharge: 2019-11-05 | Disposition: A | Discharge status: Home / Self Care | Payer: Self-pay | Attending: Urgent Care | Admitting: Urgent Care

## 2019-11-05 ENCOUNTER — Other Ambulatory Visit: Payer: Self-pay

## 2019-11-05 DIAGNOSIS — Z202 Contact with and (suspected) exposure to infections with a predominantly sexual mode of transmission: Secondary | ICD-10-CM | POA: Insufficient documentation

## 2019-11-05 DIAGNOSIS — R369 Urethral discharge, unspecified: Secondary | ICD-10-CM | POA: Insufficient documentation

## 2019-11-05 MED ORDER — AZITHROMYCIN 250 MG PO TABS
ORAL_TABLET | ORAL | Status: AC
  Filled 2019-11-05: qty 4

## 2019-11-05 MED ORDER — AZITHROMYCIN 250 MG PO TABS
1000.0000 mg | ORAL_TABLET | Freq: Once | ORAL | Status: AC
  Administered 2019-11-05: 1000 mg via ORAL

## 2019-11-05 NOTE — Discharge Instructions (Addendum)
Avoid all forms of sexual intercourse (oral, vaginal, anal) for the next 7 days to avoid spreading/reinfecting. Return if symptoms worsen/do not resolve, you develop fever, abdominal pain, blood in your urine, or are re-exposed to an STI.  

## 2019-11-05 NOTE — ED Triage Notes (Signed)
Pt presents for STD's testing. States his partner tested positive for Chlamydia today. States he is having clear peniles discharged.

## 2019-11-05 NOTE — ED Provider Notes (Signed)
  [facility]   MRN: [id number] DOB: 2000/06/14  Subjective:   Richard [name] is a 19 y.o. male presenting for exposure to chlamydia and penile discharge in the past few days. One of his sex partners notified him of her test results today.   Denies taking chronic medications.    No Known Allergies  Past Medical History:  Diagnosis Date  . Scoliosis   . Seizure (HCC) 03/20/2019     History reviewed. No pertinent surgical history.  Family History  Problem Relation Age of Onset  . Sickle cell anemia Mother   . CVA Mother        CASUSED BY SICKLE CELL   . Seizures Mother        after cva  . Healthy Father     Social History   Tobacco Use  . Smoking status: Current Every Day Smoker    Packs/day: 0.20    Types: Cigarettes  . Smokeless tobacco: Never Used  . Tobacco comment: 2 cigs per day  Vaping Use  . Vaping Use: Never used  Substance Use Topics  . Alcohol use: Never  . Drug use: Never    ROS   Objective:   Vitals: BP 119/73 (BP Location: Right Arm)   Pulse (!) 55   Temp 98.8 F (37.1 C) (Oral)   Resp 17   SpO2 100%   Physical Exam Constitutional:      General: He is not in acute distress.    Appearance: Normal appearance. He is well-developed and normal weight. He is not ill-appearing, toxic-appearing or diaphoretic.  HENT:     Head: Normocephalic and atraumatic.     Right Ear: External ear normal.     Left Ear: External ear normal.     Nose: Nose normal.     Mouth/Throat:     Pharynx: Oropharynx is clear.  Eyes:     General: No scleral icterus.       Right eye: No discharge.        Left eye: No discharge.     Extraocular Movements: Extraocular movements intact.     Pupils: Pupils are equal, round, and reactive to light.  Cardiovascular:     Rate and Rhythm: Normal rate.  Pulmonary:     Effort: Pulmonary effort is normal.  Musculoskeletal:     Cervical back: Normal range of motion.  Neurological:     Mental Status: He is alert and  oriented to person, place, and time.  Psychiatric:        Mood and Affect: Mood normal.        Behavior: Behavior normal.        Thought Content: Thought content normal.        Judgment: Judgment normal.     Assessment and Plan :   PDMP not reviewed this encounter.  1. Exposure to chlamydia   2. Exposure to STD   3. Penile discharge     Patient treated empirically as per CDC guidelines with azithromycin to cover for exposure to chlamydia.  Labs pending.   Counseled on safe sex practices including abstaining for 1 week following treatment.  Counseled patient on potential for adverse effects with medications prescribed/recommended today, ER and return-to-clinic precautions discussed, patient verbalized understanding.    Wallis Bamberg, [place] 11/05/19 1916

## 2019-11-06 LAB — CYTOLOGY, (ORAL, ANAL, URETHRAL) ANCILLARY ONLY
Chlamydia: NEGATIVE
Comment: NEGATIVE
Comment: NEGATIVE
Comment: NORMAL
Neisseria Gonorrhea: NEGATIVE
Trichomonas: NEGATIVE

## 2019-11-30 ENCOUNTER — Emergency Department (HOSPITAL_COMMUNITY)
Admission: EM | Admit: 2019-11-30 | Discharge: 2019-12-01 | Disposition: A | Discharge status: Home / Self Care | Payer: Self-pay | Attending: Emergency Medicine | Admitting: Emergency Medicine

## 2019-11-30 ENCOUNTER — Encounter (HOSPITAL_COMMUNITY): Payer: Self-pay

## 2019-11-30 ENCOUNTER — Emergency Department (HOSPITAL_COMMUNITY): Payer: Self-pay

## 2019-11-30 DIAGNOSIS — R0602 Shortness of breath: Secondary | ICD-10-CM | POA: Insufficient documentation

## 2019-11-30 DIAGNOSIS — F17210 Nicotine dependence, cigarettes, uncomplicated: Secondary | ICD-10-CM | POA: Insufficient documentation

## 2019-11-30 DIAGNOSIS — R791 Abnormal coagulation profile: Secondary | ICD-10-CM | POA: Insufficient documentation

## 2019-11-30 DIAGNOSIS — R7989 Other specified abnormal findings of blood chemistry: Secondary | ICD-10-CM

## 2019-11-30 DIAGNOSIS — R070 Pain in throat: Secondary | ICD-10-CM | POA: Insufficient documentation

## 2019-11-30 DIAGNOSIS — U071 COVID-19: Secondary | ICD-10-CM | POA: Insufficient documentation

## 2019-11-30 LAB — CBC WITH DIFFERENTIAL/PLATELET
Abs Immature Granulocytes: 0.01 10*3/uL (ref 0.00–0.07)
Basophils Absolute: 0 10*3/uL (ref 0.0–0.1)
Basophils Relative: 1 %
Eosinophils Absolute: 0 10*3/uL (ref 0.0–0.5)
Eosinophils Relative: 1 %
HCT: 40.8 % (ref 39.0–52.0)
Hemoglobin: 14 g/dL (ref 13.0–17.0)
Immature Granulocytes: 0 %
Lymphocytes Relative: 46 %
Lymphs Abs: 2 10*3/uL (ref 0.7–4.0)
MCH: 29.9 pg (ref 26.0–34.0)
MCHC: 34.3 g/dL (ref 30.0–36.0)
MCV: 87 fL (ref 80.0–100.0)
Monocytes Absolute: 0.6 10*3/uL (ref 0.1–1.0)
Monocytes Relative: 13 %
Neutro Abs: 1.7 10*3/uL (ref 1.7–7.7)
Neutrophils Relative %: 39 %
Platelets: 197 10*3/uL (ref 150–400)
RBC: 4.69 MIL/uL (ref 4.22–5.81)
RDW: 13.4 % (ref 11.5–15.5)
WBC: 4.4 10*3/uL (ref 4.0–10.5)
nRBC: 0 % (ref 0.0–0.2)

## 2019-11-30 LAB — COMPREHENSIVE METABOLIC PANEL
ALT: 13 U/L (ref 0–44)
AST: 24 U/L (ref 15–41)
Albumin: 4 g/dL (ref 3.5–5.0)
Alkaline Phosphatase: 62 U/L (ref 38–126)
Anion gap: 9 (ref 5–15)
BUN: 6 mg/dL (ref 6–20)
CO2: 27 mmol/L (ref 22–32)
Calcium: 9.5 mg/dL (ref 8.9–10.3)
Chloride: 100 mmol/L (ref 98–111)
Creatinine, Ser: 0.93 mg/dL (ref 0.61–1.24)
GFR calc Af Amer: >60 mL/min (ref >60)
GFR calc non Af Amer: >60 mL/min (ref >60)
Glucose, Bld: 93 mg/dL (ref 70–99)
Potassium: 3.9 mmol/L (ref 3.5–5.1)
Sodium: 136 mmol/L (ref 135–145)
Total Bilirubin: 0.9 mg/dL (ref 0.3–1.2)
Total Protein: 6.9 g/dL (ref 6.5–8.1)

## 2019-11-30 LAB — D-DIMER, QUANTITATIVE: D-Dimer, Quant: 0.58 ug/mL-FEU — ABNORMAL HIGH (ref 0.00–0.50)

## 2019-11-30 LAB — LACTIC ACID, PLASMA
Lactic Acid, Venous: 2 mmol/L (ref 0.5–1.9)
Lactic Acid, Venous: 1.4 mmol/L (ref 0.5–1.9)

## 2019-11-30 MED ORDER — SODIUM CHLORIDE 0.9 % IV BOLUS
1000.0000 mL | Freq: Once | INTRAVENOUS | Status: AC
  Administered 2019-11-30: 1000 mL via INTRAVENOUS

## 2019-11-30 MED ORDER — ALBUTEROL SULFATE HFA 108 (90 BASE) MCG/ACT IN AERS
2.0000 | INHALATION_SPRAY | Freq: Once | RESPIRATORY_TRACT | Status: AC
  Administered 2019-11-30: 2 via RESPIRATORY_TRACT
  Filled 2019-11-30: qty 6.7

## 2019-11-30 MED ORDER — ALUM & MAG HYDROXIDE-SIMETH 200-200-20 MG/5ML PO SUSP
30.0000 mL | Freq: Once | ORAL | Status: AC
  Administered 2019-11-30: 30 mL via ORAL
  Filled 2019-11-30: qty 30

## 2019-11-30 MED ORDER — IOHEXOL 350 MG/ML SOLN: 75.0000 mL | Freq: Once | INTRAVENOUS | Status: DC | PRN

## 2019-11-30 MED ORDER — LIDOCAINE VISCOUS HCL 2 % MT SOLN
15.0000 mL | Freq: Once | OROMUCOSAL | Status: AC
  Administered 2019-11-30: 15 mL via ORAL
  Filled 2019-11-30: qty 15

## 2019-11-30 NOTE — ED Provider Notes (Signed)
[facility] EMERGENCY DEPARTMENT Provider Note   CSN: [id number] Arrival date & time: 11/30/19  1817     History Chief Complaint  Patient presents with  . Shortness of Breath    Richard [name] is a 19 y.o. male with past medical history of sickle cell trait, scoliosis presenting to the ED today by EMS after shortness of breath today.  Patient states that he is had loss of taste and smell, headaches, myalgias over the last week, testing positive for COVID-19 2 days ago.  Patient states that he has not had any chest pain or shortness of breath through his course up until an hour or so prior to arrival the patient states that he was outside when he felt a sudden sensation of pain/tightness in his throat and associated shortness of breath.  EMS was called and reportedly on arrival they found sats in the 70s for which the patient was placed on 4 L nasal cannula with improvement to 98%.  By my evaluation, patient is satting 100% on 4 L nasal cannula in no respiratory distress, denies chest pain or shortness of breath.  Patient turned off nasal cannula maintaining sats of 100% even with ambulation.  Complaining only of some pleuritic discomfort to his lower throat.  The history is provided by the patient.  Illness Quality:  Shortness of breath Severity:  Moderate Onset quality:  Sudden Duration:  3 hours Timing:  Constant Progression:  Worsening Chronicity:  New Context:  1 week of COVID-19 Associated symptoms: headaches, myalgias and shortness of breath   Associated symptoms: no abdominal pain, no chest pain, no cough, no fever, no rash and no vomiting        Past Medical History:  Diagnosis Date  . Scoliosis   . Seizure (HCC) 03/20/2019    Patient Active Problem List   Diagnosis Date Noted  . Seizure-like activity (HCC) 03/21/2019  . Seizure (HCC) 03/20/2019    History reviewed. No pertinent surgical history.     Family History  Problem Relation Age of Onset    . Sickle cell anemia Mother   . CVA Mother        CASUSED BY SICKLE CELL   . Seizures Mother        after cva  . Healthy Father     Social History   Tobacco Use  . Smoking status: Current Every Day Smoker    Packs/day: 0.20    Types: Cigarettes  . Smokeless tobacco: Never Used  . Tobacco comment: 2 cigs per day  Vaping Use  . Vaping Use: Never used  Substance Use Topics  . Alcohol use: Never  . Drug use: Never    Home Medications Prior to Admission medications   Medication Sig Start Date End Date Taking? Authorizing Provider  gabapentin (NEURONTIN) 100 MG capsule Take 1 capsule (100 mg total) by mouth 3 (three) times daily. For anxiety or nerve pain 07/17/19 11/05/19  Eustace Moore, MD    Allergies    Patient has no known allergies.  Review of Systems   Review of Systems  Constitutional: Positive for chills. Negative for fever.  HENT: Negative for facial swelling and voice change.        Positive for throat pain, tightness  Eyes: Negative for redness and visual disturbance.  Respiratory: Positive for shortness of breath. Negative for cough.   Cardiovascular: Negative for chest pain and palpitations.  Gastrointestinal: Negative for abdominal pain and vomiting.  Genitourinary: Negative for difficulty urinating  and dysuria.  Musculoskeletal: Positive for myalgias. Negative for gait problem and joint swelling.  Skin: Negative for rash and wound.  Neurological: Positive for headaches. Negative for dizziness.  Psychiatric/Behavioral: Negative for confusion and suicidal ideas.    Physical Exam Updated Vital Signs BP 123/85   Pulse (!) 49   Temp 98.8 F (37.1 C) (Oral)   Resp 13   SpO2 100%   Physical Exam Constitutional:      General: He is not in acute distress. HENT:     Head: Normocephalic and atraumatic.     Comments: Oropharynx benign, no tonsillitis, no appreciable foreign body.    Mouth/Throat:     Mouth: Mucous membranes are moist.     Pharynx:  Oropharynx is clear.  Eyes:     General: No scleral icterus.    Pupils: Pupils are equal, round, and reactive to light.  Cardiovascular:     Rate and Rhythm: Normal rate and regular rhythm.     Pulses: Normal pulses.  Pulmonary:     Effort: Pulmonary effort is normal. No respiratory distress.     Comments: Good air movement throughout, no appreciable wheeze or stridor even with forced expiration Abdominal:     General: There is no distension.     Tenderness: There is no abdominal tenderness.  Musculoskeletal:        General: No tenderness or deformity.     Cervical back: Normal range of motion and neck supple.  Neurological:     General: No focal deficit present.     Mental Status: He is alert and oriented to person, place, and time.  Psychiatric:        Mood and Affect: Mood normal.        Behavior: Behavior normal.     ED Results / Procedures / Treatments   Labs (all labs ordered are listed, but only abnormal results are displayed) Labs Reviewed  LACTIC ACID, PLASMA - Abnormal; Notable for the following components:      Result Value   Lactic Acid, Venous 2.0 (*)    All other components within normal limits  D-DIMER, QUANTITATIVE (NOT AT [facility]) - Abnormal; Notable for the following components:   D-Dimer, Quant 0.58 (*)    All other components within normal limits  LACTIC ACID, PLASMA  COMPREHENSIVE METABOLIC PANEL  CBC WITH DIFFERENTIAL/PLATELET  URINALYSIS, ROUTINE W REFLEX MICROSCOPIC    EKG EKG Interpretation  Date/Time:  Monday November 30 2019 18:27:47 EDT Ventricular Rate:  65 PR Interval:  132 QRS Duration: 90 QT Interval:  360 QTC Calculation: 374 R Axis:   77 Text Interpretation: Normal sinus rhythm with sinus arrhythmia Normal ECG No significant change since last tracing Confirmed by Richardean Canal [phone]) on 11/30/2019 7:36:02 PM   Radiology DG Chest Portable 1 View  Result Date: 11/30/2019 CLINICAL DATA:  Shortness of breath, COVID EXAM: PORTABLE  CHEST 1 VIEW COMPARISON:  Chest radiograph dated 03/18/2019 FINDINGS: The heart size and mediastinal contours are within normal limits. Both lungs are clear. Thoracolumbar scoliosis is redemonstrated. IMPRESSION: No active disease. Electronically Signed   By: Romona Curls M.D.   On: 11/30/2019 19:22    Procedures Procedures (including critical care time)  Medications Ordered in ED Medications  iohexol (OMNIPAQUE) 350 MG/ML injection 75 mL (has no administration in time range)  albuterol (VENTOLIN HFA) 108 (90 Base) MCG/ACT inhaler 2 puff (2 puffs Inhalation Given 11/30/19 2037)  sodium chloride 0.9 % bolus 1,000 mL (1,000 mLs Intravenous New Bag/Given 11/30/19  2038)  alum & mag hydroxide-simeth (MAALOX/MYLANTA) 200-200-20 MG/5ML suspension 30 mL (30 mLs Oral Given 11/30/19 2037)    And  lidocaine (XYLOCAINE) 2 % viscous mouth solution 15 mL (15 mLs Oral Given 11/30/19 2037)    ED Course  I have reviewed the triage vital signs and the nursing notes.  Pertinent labs & imaging results that were available during my care of the patient were reviewed by me and considered in my medical decision making (see chart for details).    MDM Rules/Calculators/A&P                          EKG findings by my read: Compared to prior: 03/20/2019.  Rate: 65 rhythm: sinus Axis: appropriate  PR: 132 QRS: 90 QTc: 374.  Slight concave ST elevations in anterior lateral leads suggestive of benign early repolarization, otherwise no evidence of ischemia or arrhythmia, nor any other pathologic findings concerning considering patient presentation. Findings discussed with attending who agrees.  Differential diagnosis considered: COVID-19, acute hypoxic respiratory failure, PE, pneumothorax, pneumonia, esophageal foreign body, epiglottitis, angioedema  Patient presenting to the ED with a sensation of lower throat discomfort and sudden onset of shortness of breath in the setting of COVID-19, although there is report of  hypoxia to 70 in the field, the patient has excellent saturation here, is not in any respiratory distress and is essentially asymptomatic with exception of some lower throat discomfort.  No appreciable stridor on my exam, laryngospasm considered though complaints are distal to the larynx.  Essentially no suspicion for ACS in this otherwise healthy 19 year old with atypical complaints, chest x-ray benign with no evidence of pneumonia or pneumothorax.  CBC and chemistries unremarkable  Low suspicion for PE however sudden onset of shortness of breath and hypoxia could be attributable to this, will follow D-dimer  D-dimer marginally positive, will obtain CT PE study that should evaluate the distal esophagus and trachea well.  Labs reviewed and interpreted by myself with significant findings above. Imaging reviewed by myself and interpreted by radiologist.  Case and plan above discussed with my attending Dr. Silverio Lay   Final Clinical Impression(s) / ED Diagnoses Final diagnoses:  SOB (shortness of breath)  Pain in throat  COVID-19    Rx / DC Orders ED Discharge Orders    None     Patient signed out to Dr. Preston Fleeting pending CTA chest. If negative, anticipate discharge with PCP followup given extremely stable presentation and reassuring w/u.  Labs, studies and imaging reviewed by myself and considered in medical decision making if ordered. Imaging interpreted by radiology. Pt was discussed with my attending, Dr. Silverio Lay  Electronically signed by:  Christiane Ha Redding9/14/202112:05 AM       Loree Fee, MD 12/01/19 0005    Charlynne Pander, MD 12/05/19 1455

## 2019-11-30 NOTE — ED Triage Notes (Addendum)
Pt arrives to ED via [facility] w/ c/o cough, fever, sob. Pt found to be 70% on RA w/ EMS, SPO2 up to 98% on 4 lpm. Pt tested positive for covid 2 days ago, symptoms began 1 week ago. Pt received 125 mg solumedrol w/ EMS.

## 2019-12-01 NOTE — ED Notes (Signed)
[facility] called to cancel transport  

## 2019-12-01 NOTE — ED Notes (Signed)
[facility] called to transport pt 

## 2019-12-01 NOTE — ED Notes (Signed)
Pt was able to tolerate ambulation in the room, pt saturations only decreased to 96 but readily increased back to 100 percent while sitting , pt denies any SOB at this time

## 2019-12-01 NOTE — ED Provider Notes (Signed)
Care assumed from Dr. Jeanie Sewer and Dr. Silverio Lay, patient with COVID-19 who was complaining of dyspnea and initially had hypoxia and D-dimer was mildly elevated.  Case was signed out pending CT angiogram of the chest.  However, patient is now refusing CT angiogram.  His oxygen saturations have been adequate since arrival in the hospital.  I discussed with him the risks of undiagnosed pulmonary embolism including the risk of death.  Patient expressed understanding, but states that he feels fine and does not want to have the test done.  He was ambulated in the emergency department and maintained adequate oxygen saturation with ambulation.  He was discharged with instructions to return should he change his mind about having CT angiogram, or if any of his symptoms are getting worse.   Dione Booze, MD 12/01/19 [phone]

## 2019-12-01 NOTE — Discharge Instructions (Addendum)
You did not allow [place] to do a CT angiogram to see if you have blood clots in your lungs. Blood clots in the lung are very dangerous, and can even kill you. If you change your mind about having the CT angiogram, return and we will do the test.  Also,return if your symptoms are getting worse.

## 2020-02-25 ENCOUNTER — Encounter (HOSPITAL_COMMUNITY): Payer: Self-pay | Admitting: Emergency Medicine

## 2020-02-25 ENCOUNTER — Emergency Department (HOSPITAL_COMMUNITY)
Admission: EM | Admit: 2020-02-25 | Discharge: 2020-02-25 | Disposition: A | Discharge status: Home / Self Care | Payer: Self-pay | Attending: Emergency Medicine | Admitting: Emergency Medicine

## 2020-02-25 DIAGNOSIS — Z5321 Procedure and treatment not carried out due to patient leaving prior to being seen by health care provider: Secondary | ICD-10-CM | POA: Insufficient documentation

## 2020-02-25 DIAGNOSIS — R42 Dizziness and giddiness: Secondary | ICD-10-CM | POA: Insufficient documentation

## 2020-02-25 LAB — URINALYSIS, ROUTINE W REFLEX MICROSCOPIC
Bilirubin Urine: NEGATIVE
Glucose, UA: NEGATIVE mg/dL
Hgb urine dipstick: NEGATIVE
Ketones, ur: NEGATIVE mg/dL
Leukocytes,Ua: NEGATIVE
Nitrite: NEGATIVE
Protein, ur: NEGATIVE mg/dL
Specific Gravity, Urine: 1.013 (ref 1.005–1.030)
pH: 7 (ref 5.0–8.0)

## 2020-02-25 LAB — CBC
HCT: 37.4 % — ABNORMAL LOW (ref 39.0–52.0)
Hemoglobin: 13.5 g/dL (ref 13.0–17.0)
MCH: 32.1 pg (ref 26.0–34.0)
MCHC: 36.1 g/dL — ABNORMAL HIGH (ref 30.0–36.0)
MCV: 88.8 fL (ref 80.0–100.0)
Platelets: 239 10*3/uL (ref 150–400)
RBC: 4.21 MIL/uL — ABNORMAL LOW (ref 4.22–5.81)
RDW: 14.2 % (ref 11.5–15.5)
WBC: 6.4 10*3/uL (ref 4.0–10.5)
nRBC: 0 % (ref 0.0–0.2)

## 2020-02-25 LAB — BASIC METABOLIC PANEL
Anion gap: 9 (ref 5–15)
BUN: 7 mg/dL (ref 6–20)
CO2: 28 mmol/L (ref 22–32)
Calcium: 9.7 mg/dL (ref 8.9–10.3)
Chloride: 103 mmol/L (ref 98–111)
Creatinine, Ser: 0.89 mg/dL (ref 0.61–1.24)
GFR, Estimated: >60 mL/min (ref >60)
Glucose, Bld: 66 mg/dL — ABNORMAL LOW (ref 70–99)
Potassium: 3.8 mmol/L (ref 3.5–5.1)
Sodium: 140 mmol/L (ref 135–145)

## 2020-02-25 NOTE — ED Triage Notes (Signed)
Pt reports while working upstairs he began to fel dizzy and like his vision was "shakey" bilaterally. States that he feels better now, but still a little lightheaded. Denies any difficulty with speech or moving his limbs. A/ox4, speech clear, moves all limbs equally.

## 2020-02-25 NOTE — ED Notes (Signed)
No answer for VS x 2 

## 2020-03-16 ENCOUNTER — Ambulatory Visit (HOSPITAL_COMMUNITY)
Admission: EM | Admit: 2020-03-16 | Discharge: 2020-03-16 | Disposition: A | Discharge status: Home / Self Care | Payer: Self-pay | Attending: Family Medicine | Admitting: Family Medicine

## 2020-03-16 ENCOUNTER — Other Ambulatory Visit: Payer: Self-pay

## 2020-03-16 ENCOUNTER — Encounter (HOSPITAL_COMMUNITY): Payer: Self-pay | Admitting: Emergency Medicine

## 2020-03-16 DIAGNOSIS — R369 Urethral discharge, unspecified: Secondary | ICD-10-CM | POA: Insufficient documentation

## 2020-03-16 MED ORDER — CEFTRIAXONE SODIUM 500 MG IJ SOLR
INTRAMUSCULAR | Status: AC
  Filled 2020-03-16: qty 500

## 2020-03-16 MED ORDER — DOXYCYCLINE HYCLATE 100 MG PO CAPS: 100.0000 mg | ORAL_CAPSULE | Freq: Two times a day (BID) | ORAL | 0 refills | Status: DC

## 2020-03-16 MED ORDER — LIDOCAINE HCL (PF) 1 % IJ SOLN
INTRAMUSCULAR | Status: AC
  Filled 2020-03-16: qty 2

## 2020-03-16 MED ORDER — CEFTRIAXONE SODIUM 500 MG IJ SOLR
500.0000 mg | Freq: Once | INTRAMUSCULAR | Status: AC
  Administered 2020-03-16: 21:00:00 500 mg via INTRAMUSCULAR

## 2020-03-16 NOTE — ED Triage Notes (Signed)
Patient reports a penile discharge that he noticed yesterday:intermittent stinging

## 2020-03-16 NOTE — Discharge Instructions (Signed)

## 2020-03-17 LAB — CYTOLOGY, (ORAL, ANAL, URETHRAL) ANCILLARY ONLY
Chlamydia: POSITIVE — AB
Comment: NEGATIVE
Comment: NEGATIVE
Comment: NORMAL
Neisseria Gonorrhea: NEGATIVE
Trichomonas: NEGATIVE

## 2020-03-19 NOTE — ED Provider Notes (Signed)
  [facility]ARE [facility]   [id number] 03/16/20 Arrival Time: 1808  ASSESSMENT & PLAN:  1. Penile discharge       Discharge Instructions     You have been given the following today for treatment of suspected gonorrhea and/or chlamydia:  cefTRIAXone (ROCEPHIN) injection 500 mg  Please pick up your prescription for doxycycline 100 mg and begin taking twice daily for the next seven (7) days.  Even though we have treated you today, we have sent testing for sexually transmitted infections. We will notify you of any positive results once they are received. If required, we will prescribe any medications you might need.  Please refrain from all sexual activity for at least the next seven days.     Will notify of any positive results. Instructed to refrain from sexual activity for at least seven days.  Reviewed expectations re: course of current medical issues. Questions answered. Outlined signs and symptoms indicating need for more acute intervention. Patient verbalized understanding. After Visit Summary given.   SUBJECTIVE:  Richard [name] is a 20 y.o. male who presents with complaint of penile discharge; since yesterday; mild "tingling" in penis. Reports that he is sexually active. OTC treatment: none.  OBJECTIVE:  Vitals:   03/16/20 2018  BP: 128/83  Pulse: (!) 54  Resp: 16  Temp: 97.9 F (36.6 C)  SpO2: 99%     General appearance: alert, cooperative, appears stated age and no distress Lungs: unlabored respirations; speaks full sentences without difficulty Back: no CVA tenderness; FROM at waist Abdomen: soft, non-tender GU: normal appearing Skin: warm and dry Psychological: alert and cooperative; normal mood and affect.    No Known Allergies  Past Medical History:  Diagnosis Date  . Scoliosis   . Seizure (HCC) 03/20/2019   Family History  Problem Relation Age of Onset  . Sickle cell anemia Mother   . CVA Mother        CASUSED BY SICKLE CELL   . Seizures  Mother        after cva  . Healthy Father    Social History   Socioeconomic History  . Marital status: Significant Other    Spouse name: Not on file  . Number of children: Not on file  . Years of education: Not on file  . Highest education level: Not on file  Occupational History  . Not on file  Tobacco Use  . Smoking status: Current Every Day Smoker    Packs/day: 0.20    Types: Cigarettes  . Smokeless tobacco: Never Used  . Tobacco comment: 2 cigs per day  Vaping Use  . Vaping Use: Never used  Substance and Sexual Activity  . Alcohol use: Never  . Drug use: Never  . Sexual activity: Not on file  Other Topics Concern  . Not on file  Social History Narrative  . Not on file   Social Determinants of Health   Financial Resource Strain: Not on file  Food Insecurity: Not on file  Transportation Needs: Not on file  Physical Activity: Not on file  Stress: Not on file  Social Connections: Not on file  Intimate Partner Violence: Not on file          Mardella Layman, MD 03/19/20 1007

## 2020-04-01 ENCOUNTER — Encounter (HOSPITAL_COMMUNITY): Payer: Self-pay

## 2020-04-01 ENCOUNTER — Ambulatory Visit (HOSPITAL_COMMUNITY)
Admission: EM | Admit: 2020-04-01 | Discharge: 2020-04-01 | Disposition: A | Discharge status: Home / Self Care | Payer: Self-pay | Attending: Emergency Medicine | Admitting: Emergency Medicine

## 2020-04-01 ENCOUNTER — Other Ambulatory Visit: Payer: Self-pay

## 2020-04-01 DIAGNOSIS — J029 Acute pharyngitis, unspecified: Secondary | ICD-10-CM | POA: Insufficient documentation

## 2020-04-01 DIAGNOSIS — Z202 Contact with and (suspected) exposure to infections with a predominantly sexual mode of transmission: Secondary | ICD-10-CM

## 2020-04-01 DIAGNOSIS — Z20822 Contact with and (suspected) exposure to covid-19: Secondary | ICD-10-CM | POA: Insufficient documentation

## 2020-04-01 DIAGNOSIS — F17210 Nicotine dependence, cigarettes, uncomplicated: Secondary | ICD-10-CM | POA: Insufficient documentation

## 2020-04-01 DIAGNOSIS — R369 Urethral discharge, unspecified: Secondary | ICD-10-CM | POA: Insufficient documentation

## 2020-04-01 DIAGNOSIS — Z113 Encounter for screening for infections with a predominantly sexual mode of transmission: Secondary | ICD-10-CM | POA: Insufficient documentation

## 2020-04-01 DIAGNOSIS — B349 Viral infection, unspecified: Secondary | ICD-10-CM | POA: Insufficient documentation

## 2020-04-01 DIAGNOSIS — Z79899 Other long term (current) drug therapy: Secondary | ICD-10-CM | POA: Insufficient documentation

## 2020-04-01 LAB — POCT RAPID STREP A, ED / UC: Streptococcus, Group A Screen (Direct): NEGATIVE

## 2020-04-01 MED ORDER — ACETAMINOPHEN 325 MG PO TABS
650.0000 mg | ORAL_TABLET | Freq: Once | ORAL | Status: AC
  Administered 2020-04-01: 650 mg via ORAL

## 2020-04-01 MED ORDER — ACETAMINOPHEN 325 MG PO TABS
ORAL_TABLET | ORAL | Status: AC
  Filled 2020-04-01: qty 2

## 2020-04-01 NOTE — ED Provider Notes (Signed)
[facility]    CSN: [id number] Arrival date & time: 04/01/20  1810      History   Chief Complaint Chief Complaint  Patient presents with  . Sore Throat  . SEXUALLY TRANSMITTED DISEASE    HPI Richard [name] is a 20 y.o. male.   Presents with 2-day history of sore throat.  He also reports penile discharge.  Denies fever, chills, rash, cough, shortness of breath, abdominal pain, dysuria, testicular pain, or other symptoms.  No treatments attempted at home.  Patient was seen here on 03/16/2020; diagnosed with penile discharge; treated with Rocephin and doxycycline; cytology positive for chlamydia.  The history is provided by the patient and medical records.    Past Medical History:  Diagnosis Date  . Scoliosis   . Seizure (HCC) 03/20/2019    Patient Active Problem List   Diagnosis Date Noted  . Seizure-like activity (HCC) 03/21/2019  . Seizure (HCC) 03/20/2019    History reviewed. No pertinent surgical history.     Home Medications    Prior to Admission medications   Medication Sig Start Date End Date Taking? Authorizing Provider  doxycycline (VIBRAMYCIN) 100 MG capsule Take 1 capsule (100 mg total) by mouth 2 (two) times daily. 03/16/20   Mardella Layman, MD  gabapentin (NEURONTIN) 100 MG capsule Take 1 capsule (100 mg total) by mouth 3 (three) times daily. For anxiety or nerve pain 07/17/19 11/05/19  Eustace Moore, MD    Family History Family History  Problem Relation Age of Onset  . Sickle cell anemia Mother   . CVA Mother        CASUSED BY SICKLE CELL   . Seizures Mother        after cva  . Healthy Father     Social History Social History   Tobacco Use  . Smoking status: Current Every Day Smoker    Packs/day: 0.20    Types: Cigarettes  . Smokeless tobacco: Never Used  . Tobacco comment: 2 cigs per day  Vaping Use  . Vaping Use: Never used  Substance Use Topics  . Alcohol use: Never  . Drug use: Never     Allergies   Patient has no  known allergies.   Review of Systems Review of Systems  Constitutional: Negative for chills and fever.  HENT: Positive for sore throat. Negative for ear pain.   Eyes: Negative for pain and visual disturbance.  Respiratory: Negative for cough and shortness of breath.   Cardiovascular: Negative for chest pain and palpitations.  Gastrointestinal: Negative for abdominal pain and vomiting.  Genitourinary: Positive for penile discharge. Negative for dysuria, flank pain and hematuria.  Musculoskeletal: Negative for arthralgias and back pain.  Skin: Negative for color change and rash.  Neurological: Negative for seizures and syncope.  All other systems reviewed and are negative.    Physical Exam Triage Vital Signs ED Triage Vitals [04/01/20 2009]  Enc Vitals Group     BP      Pulse      Resp      Temp      Temp src      SpO2      Weight      Height      Head Circumference      Peak Flow      Pain Score 6     Pain Loc      Pain Edu?      Excl. in GC?    No data found.  Updated Vital Signs BP 133/77 (BP Location: Right Arm)   Pulse 77   Temp (!) 101.6 F (38.7 C) (Oral)   Resp 16   SpO2 100%   Visual Acuity Right Eye Distance:   Left Eye Distance:   Bilateral Distance:    Right Eye Near:   Left Eye Near:    Bilateral Near:     Physical Exam Vitals and nursing note reviewed.  Constitutional:      General: He is not in acute distress.    Appearance: He is well-developed and well-nourished. He is not ill-appearing.  HENT:     Head: Normocephalic and atraumatic.     Right Ear: Tympanic membrane normal.     Left Ear: Tympanic membrane normal.     Nose: Nose normal.     Mouth/Throat:     Mouth: Mucous membranes are moist.     Pharynx: Posterior oropharyngeal erythema present.  Eyes:     Conjunctiva/sclera: Conjunctivae normal.  Cardiovascular:     Rate and Rhythm: Normal rate and regular rhythm.     Heart sounds: Normal heart sounds.  Pulmonary:     Effort:  Pulmonary effort is normal. No respiratory distress.     Breath sounds: Normal breath sounds.  Abdominal:     Palpations: Abdomen is soft.     Tenderness: There is no abdominal tenderness. There is no right CVA tenderness, left CVA tenderness, guarding or rebound.  Musculoskeletal:        General: No edema.     Cervical back: Neck supple.  Skin:    General: Skin is warm and dry.     Findings: No rash.  Neurological:     General: No focal deficit present.     Mental Status: He is alert and oriented to person, place, and time.  Psychiatric:        Mood and Affect: Mood and affect and mood normal.        Behavior: Behavior normal.      UC Treatments / Results  Labs (all labs ordered are listed, but only abnormal results are displayed) Labs Reviewed  SARS CORONAVIRUS 2 (TAT 6-24 HRS)  CULTURE, GROUP A STREP [facility])  POCT RAPID STREP A, ED / UC  CYTOLOGY, (ORAL, ANAL, URETHRAL) ANCILLARY ONLY    EKG   Radiology No results found.  Procedures Procedures (including critical care time)  Medications Ordered in UC Medications  acetaminophen (TYLENOL) tablet 650 mg (650 mg Oral Given 04/01/20 2136)    Initial Impression / Assessment and Plan / UC Course  I have reviewed the triage vital signs and the nursing notes.  Pertinent labs & imaging results that were available during my care of the patient were reviewed by me and considered in my medical decision making (see chart for details).   Sore throat, Viral illness.  Penile discharge, STD screening.  Rapid strep negative; culture pending.  COVID pending.  Instructed patient to self quarantine until the test results are back.  Discussed symptomatic treatment including Tylenol, rest, hydration.  Instructed patient to follow up with PCP if his symptoms are not improving.  Patient obtained urethral self swab for STD testing.  Instructed him to abstain from sexual activity until the test results are back.  Discussed that we will call  him if the results are positive requiring treatment.  Discussed that he and his sexual partners may require treatment at that time.  Patient agrees to plan of care.    Final Clinical Impressions(s) /  UC Diagnoses   Final diagnoses:  Sore throat  Viral illness  Penile discharge  Screen for STD (sexually transmitted disease)     Discharge Instructions     Your rapid strep test is negative.  A throat culture is pending; we will call you if it is positive requiring treatment.    Your COVID test is pending.  You should self quarantine until the test result is back.    Take Tylenol or ibuprofen as needed for fever or discomfort.  Rest and keep yourself hydrated.    Follow-up with your primary care provider if your symptoms are not improving.    Your STD tests are pending.  If your test results are positive, we will call you.  You and your sexual partner(s) may require treatment at that time.  Do not have sexual activity until the test results are back.     ED Prescriptions    None     PDMP not reviewed this encounter.   Mickie Bail, NP 04/01/20 2139

## 2020-04-01 NOTE — Discharge Instructions (Signed)
Your rapid strep test is negative.  A throat culture is pending; we will call you if it is positive requiring treatment.    Your COVID test is pending.  You should self quarantine until the test result is back.    Take Tylenol or ibuprofen as needed for fever or discomfort.  Rest and keep yourself hydrated.    Follow-up with your primary care provider if your symptoms are not improving.    Your STD tests are pending.  If your test results are positive, we will call you.  You and your sexual partner(s) may require treatment at that time.  Do not have sexual activity until the test results are back.

## 2020-04-01 NOTE — ED Triage Notes (Signed)
Pt c/o swollen tonsils, dysphagia for two days and discharge from his penis.  Pt states he abstained from sex and completed his prior STI. Tonsils edematous, with erythema.

## 2020-04-02 LAB — SARS CORONAVIRUS 2 (TAT 6-24 HRS): SARS Coronavirus 2: NEGATIVE

## 2020-04-04 LAB — CYTOLOGY, (ORAL, ANAL, URETHRAL) ANCILLARY ONLY
Chlamydia: NEGATIVE
Comment: NEGATIVE
Comment: NEGATIVE
Comment: NORMAL
Neisseria Gonorrhea: NEGATIVE
Trichomonas: NEGATIVE

## 2020-04-04 LAB — CULTURE, GROUP A STREP (THRC)

## 2020-04-28 ENCOUNTER — Other Ambulatory Visit: Payer: Self-pay

## 2020-04-28 ENCOUNTER — Encounter (HOSPITAL_COMMUNITY): Payer: Self-pay

## 2020-04-28 ENCOUNTER — Ambulatory Visit (HOSPITAL_COMMUNITY)
Admission: EM | Admit: 2020-04-28 | Discharge: 2020-04-28 | Disposition: A | Discharge status: Home / Self Care | Payer: Self-pay | Attending: Emergency Medicine | Admitting: Emergency Medicine

## 2020-04-28 DIAGNOSIS — Z113 Encounter for screening for infections with a predominantly sexual mode of transmission: Secondary | ICD-10-CM | POA: Insufficient documentation

## 2020-04-28 DIAGNOSIS — F17210 Nicotine dependence, cigarettes, uncomplicated: Secondary | ICD-10-CM | POA: Insufficient documentation

## 2020-04-28 LAB — HIV ANTIBODY (ROUTINE TESTING W REFLEX): HIV Screen 4th Generation wRfx: NONREACTIVE

## 2020-04-28 NOTE — Discharge Instructions (Signed)
Lab results pending 2-3 days. Will be notified if any results positive so that you may be treated  Do not have sex until test results come back negative, if positive do not have sex until you finish course of treatment

## 2020-04-28 NOTE — ED Triage Notes (Signed)
Patient presents to Urgent Care for STD testing. He reports some discharge,  some stinging pain, and some mild testicle swelling x 2 days. Pt was seen on the 12/19 for same issue. He took treatment prescribed and was tested again 01/14. He is concerned as he reports GF did not take the medication as directed and continues to have discharge with stinging sensation.   Denies fever.

## 2020-04-28 NOTE — ED Provider Notes (Signed)
[facility]    CSN: [id number] Arrival date & time: 04/28/20  1549      History   Chief Complaint Chief Complaint  Patient presents with  . SEXUALLY TRANSMITTED DISEASE    HPI Richard [name] is a 20 y.o. male.   Patient presents with stinging in urethra without urination and cloudy discharge for 2 days. Denies itching, odor, frequency, urgency. Positive for chlamydia on 12/29. Finished treatment. Retested 1/14. Results negative. Partner treated but did not take medication as directed.   Past Medical History:  Diagnosis Date  . Scoliosis   . Seizure (HCC) 03/20/2019    Patient Active Problem List   Diagnosis Date Noted  . Seizure-like activity (HCC) 03/21/2019  . Seizure (HCC) 03/20/2019    History reviewed. No pertinent surgical history.     Home Medications    Prior to Admission medications   Medication Sig Start Date End Date Taking? Authorizing Provider  doxycycline (VIBRAMYCIN) 100 MG capsule Take 1 capsule (100 mg total) by mouth 2 (two) times daily. 03/16/20   Mardella Layman, MD  gabapentin (NEURONTIN) 100 MG capsule Take 1 capsule (100 mg total) by mouth 3 (three) times daily. For anxiety or nerve pain 07/17/19 11/05/19  Eustace Moore, MD    Family History Family History  Problem Relation Age of Onset  . Sickle cell anemia Mother   . CVA Mother        CASUSED BY SICKLE CELL   . Seizures Mother        after cva  . Healthy Father     Social History Social History   Tobacco Use  . Smoking status: Current Every Day Smoker    Packs/day: 0.20    Types: Cigarettes  . Smokeless tobacco: Never Used  . Tobacco comment: 2 cigs per day  Vaping Use  . Vaping Use: Never used  Substance Use Topics  . Alcohol use: Never  . Drug use: Never     Allergies   Patient has no known allergies.   Review of Systems Review of Systems  Constitutional: Negative.   HENT: Negative.   Respiratory: Negative.   Cardiovascular: Negative.   Genitourinary:  Positive for penile discharge and penile pain. Negative for decreased urine volume, difficulty urinating, dysuria, enuresis, flank pain, frequency, genital sores, hematuria, penile swelling, scrotal swelling, testicular pain and urgency.  Neurological: Negative.      Physical Exam Triage Vital Signs ED Triage Vitals  Enc Vitals Group     BP 04/28/20 1639 (S) (!) 141/77     Pulse Rate 04/28/20 1639 63     Resp 04/28/20 1639 16     Temp 04/28/20 1639 99.1 F (37.3 C)     Temp Source 04/28/20 1639 Oral     SpO2 04/28/20 1639 100 %     Weight 04/28/20 1638 130 lb (59 kg)     Height --      Head Circumference --      Peak Flow --      Pain Score 04/28/20 1638 0     Pain Loc --      Pain Edu? --      Excl. in GC? --    No data found.  Updated Vital Signs BP (S) (!) 141/77 (BP Location: Right Arm)   Pulse 63   Temp 99.1 F (37.3 C) (Oral)   Resp 16   Wt 130 lb (59 kg)   SpO2 100%   BMI 20.98 kg/m   Visual  Acuity Right Eye Distance:   Left Eye Distance:   Bilateral Distance:    Right Eye Near:   Left Eye Near:    Bilateral Near:     Physical Exam Constitutional:      Appearance: Normal appearance. He is normal weight.  HENT:     Head: Normocephalic.  Eyes:     Extraocular Movements: Extraocular movements intact.  Pulmonary:     Effort: Pulmonary effort is normal.  Genitourinary:    Comments: Deferred, self collect Musculoskeletal:        General: Deformity present.     Cervical back: Normal range of motion.  Neurological:     General: No focal deficit present.     Mental Status: He is alert and oriented to person, place, and time. Mental status is at baseline.  Psychiatric:        Mood and Affect: Mood normal.        Behavior: Behavior normal.        Thought Content: Thought content normal.        Judgment: Judgment normal.      UC Treatments / Results  Labs (all labs ordered are listed, but only abnormal results are displayed) Labs Reviewed  RPR   HIV ANTIBODY (ROUTINE TESTING W REFLEX)  GC/CHLAMYDIA PROBE AMP ([facility]) NOT AT [facility]    EKG   Radiology No results found.  Procedures Procedures (including critical care time)  Medications Ordered in UC Medications - No data to display  Initial Impression / Assessment and Plan / UC Course  I have reviewed the triage vital signs and the nursing notes.  Pertinent labs & imaging results that were available during my care of the patient were reviewed by me and considered in my medical decision making (see chart for details).  Routine screening for STI  1. Urethra swab- pending 2. Blood work for HIV and Sypillis - pending 3. Advised abstinence until labs result Final Clinical Impressions(s) / UC Diagnoses   Final diagnoses:  None   Discharge Instructions   None    ED Prescriptions    None     PDMP not reviewed this encounter.   Valinda Hoar, NP 04/28/20 1721

## 2020-04-29 LAB — GC/CHLAMYDIA PROBE AMP (~~LOC~~) NOT AT ARMC
Chlamydia: NEGATIVE
Comment: NEGATIVE
Comment: NORMAL
Neisseria Gonorrhea: NEGATIVE

## 2020-04-29 LAB — RPR: RPR Ser Ql: NONREACTIVE

## 2020-05-14 ENCOUNTER — Encounter (HOSPITAL_COMMUNITY): Payer: Self-pay | Admitting: *Deleted

## 2020-05-14 ENCOUNTER — Other Ambulatory Visit: Payer: Self-pay

## 2020-05-14 ENCOUNTER — Ambulatory Visit (HOSPITAL_COMMUNITY)
Admission: EM | Admit: 2020-05-14 | Discharge: 2020-05-14 | Disposition: A | Discharge status: Home / Self Care | Payer: Self-pay | Attending: Family Medicine | Admitting: Family Medicine

## 2020-05-14 DIAGNOSIS — N4889 Other specified disorders of penis: Secondary | ICD-10-CM

## 2020-05-14 DIAGNOSIS — Z8619 Personal history of other infectious and parasitic diseases: Secondary | ICD-10-CM

## 2020-05-14 DIAGNOSIS — R369 Urethral discharge, unspecified: Secondary | ICD-10-CM

## 2020-05-14 HISTORY — DX: Chlamydial infection, unspecified: A74.9

## 2020-05-14 LAB — POCT URINALYSIS DIPSTICK, ED / UC
Bilirubin Urine: NEGATIVE
Glucose, UA: NEGATIVE mg/dL
Ketones, ur: NEGATIVE mg/dL
Leukocytes,Ua: NEGATIVE
Nitrite: NEGATIVE
Protein, ur: NEGATIVE mg/dL
Specific Gravity, Urine: 1.02 (ref 1.005–1.030)
Urobilinogen, UA: 1 mg/dL (ref 0.0–1.0)
pH: 7 (ref 5.0–8.0)

## 2020-05-14 MED ORDER — DOXYCYCLINE HYCLATE 100 MG PO CAPS: 100.0000 mg | ORAL_CAPSULE | Freq: Two times a day (BID) | ORAL | 0 refills | Status: DC

## 2020-05-14 MED ORDER — LIDOCAINE HCL (PF) 1 % IJ SOLN
INTRAMUSCULAR | Status: AC
  Filled 2020-05-14: qty 2

## 2020-05-14 MED ORDER — CEFTRIAXONE SODIUM 500 MG IJ SOLR
500.0000 mg | Freq: Once | INTRAMUSCULAR | Status: AC
  Administered 2020-05-14: 500 mg via INTRAMUSCULAR

## 2020-05-14 MED ORDER — CEFTRIAXONE SODIUM 500 MG IJ SOLR
INTRAMUSCULAR | Status: AC
  Filled 2020-05-14: qty 500

## 2020-05-14 NOTE — ED Triage Notes (Signed)
Pt reports having eval 04/28/20 for STD check for penile "pins/needles" sensation, and was told STD panel came back negative.  States continues with sensation, but now also starting with slight penile discharge.  Denies fevers.

## 2020-05-14 NOTE — ED Provider Notes (Signed)
[facility]    CSN: [id number] Arrival date & time: 05/14/20  1542      History   Chief Complaint Chief Complaint  Patient presents with  . Penile Discharge    HPI Richard [name] is a 20 y.o. male.   Here today with about a month of penile irritation and intermittent discharge.  States there is also some discomfort when he urinates.  Denies rashes, abdominal pain, fevers, chills, nausea, and vomiting.  Had chlamydia back in December 2021 for which she was treated.  Came on 04/28/2020 for the same symptoms, penile swab came back negative for STDs.  Is still with the same partner and she is asymptomatic.     Past Medical History:  Diagnosis Date  . Chlamydia   . Scoliosis   . Seizure (HCC) 03/20/2019    Patient Active Problem List   Diagnosis Date Noted  . Seizure-like activity (HCC) 03/21/2019  . Seizure (HCC) 03/20/2019    History reviewed. No pertinent surgical history.     Home Medications    Prior to Admission medications   Medication Sig Start Date End Date Taking? Authorizing Provider  doxycycline (VIBRAMYCIN) 100 MG capsule Take 1 capsule (100 mg total) by mouth 2 (two) times daily. 05/14/20   Particia Nearing, PA-C  gabapentin (NEURONTIN) 100 MG capsule Take 1 capsule (100 mg total) by mouth 3 (three) times daily. For anxiety or nerve pain 07/17/19 11/05/19  Eustace Moore, MD    Family History Family History  Problem Relation Age of Onset  . Sickle cell anemia Mother   . CVA Mother        CASUSED BY SICKLE CELL   . Seizures Mother        after cva  . Healthy Father     Social History Social History   Tobacco Use  . Smoking status: Former Smoker    Packs/day: 0.20    Types: Cigarettes  . Smokeless tobacco: Never Used  . Tobacco comment: 2 cigs per day  Vaping Use  . Vaping Use: Never used  Substance Use Topics  . Alcohol use: Not Currently  . Drug use: Never     Allergies   Patient has no known allergies.   Review of  Systems Review of Systems Per HPI Physical Exam Triage Vital Signs ED Triage Vitals [05/14/20 1606]  Enc Vitals Group     BP 119/79     Pulse Rate (!) 56     Resp 16     Temp 98.5 F (36.9 C)     Temp Source Oral     SpO2 100 %     Weight      Height      Head Circumference      Peak Flow      Pain Score 7     Pain Loc      Pain Edu?      Excl. in GC?    No data found.  Updated Vital Signs BP 119/79   Pulse (!) 56   Temp 98.5 F (36.9 C) (Oral)   Resp 16   SpO2 100%   Visual Acuity Right Eye Distance:   Left Eye Distance:   Bilateral Distance:    Right Eye Near:   Left Eye Near:    Bilateral Near:     Physical Exam Vitals and nursing note reviewed.  Constitutional:      Appearance: Normal appearance.  HENT:     Head: Atraumatic.  Eyes:     Extraocular Movements: Extraocular movements intact.     Conjunctiva/sclera: Conjunctivae normal.  Cardiovascular:     Rate and Rhythm: Normal rate and regular rhythm.  Pulmonary:     Effort: Pulmonary effort is normal.     Breath sounds: Normal breath sounds.  Abdominal:     General: Bowel sounds are normal. There is no distension.     Palpations: Abdomen is soft.     Tenderness: There is no abdominal tenderness. There is no right CVA tenderness, left CVA tenderness or guarding.  Genitourinary:    Comments: Declines GU exam, self swab performed Musculoskeletal:        General: Normal range of motion.     Cervical back: Normal range of motion and neck supple.  Skin:    General: Skin is warm and dry.  Neurological:     General: No focal deficit present.     Mental Status: He is oriented to person, place, and time.  Psychiatric:        Mood and Affect: Mood normal.        Thought Content: Thought content normal.        Judgment: Judgment normal.      UC Treatments / Results  Labs (all labs ordered are listed, but only abnormal results are displayed) Labs Reviewed  POCT URINALYSIS DIPSTICK, ED / UC -  Abnormal; Notable for the following components:      Result Value   Hgb urine dipstick TRACE (*)    All other components within normal limits  CYTOLOGY, (ORAL, ANAL, URETHRAL) ANCILLARY ONLY    EKG   Radiology No results found.  Procedures Procedures (including critical care time)  Medications Ordered in UC Medications  cefTRIAXone (ROCEPHIN) injection 500 mg (500 mg Intramuscular Given 05/14/20 1638)    Initial Impression / Assessment and Plan / UC Course  I have reviewed the triage vital signs and the nursing notes.  Pertinent labs & imaging results that were available during my care of the patient were reviewed by me and considered in my medical decision making (see chart for details).     UA benign today, cytology swab pending.  We will treat with IM Rocephin and a course of doxycycline while waiting STD test results based on consistency of symptoms with STD and past history of the same.  Discussed abstinence until results come back and fully completed treatment and to wait at least 7 days upon completion of treatment prior to being sexually active.  Safe sexual practices reviewed.  Final Clinical Impressions(s) / UC Diagnoses   Final diagnoses:  Penile discharge  Penile irritation  History of chlamydia   Discharge Instructions   None    ED Prescriptions    Medication Sig Dispense Auth. Provider   doxycycline (VIBRAMYCIN) 100 MG capsule Take 1 capsule (100 mg total) by mouth 2 (two) times daily. 14 capsule Particia Nearing, [place]     PDMP not reviewed this encounter.   Particia Nearing, [place] 05/14/20 [phone]

## 2020-05-16 LAB — CYTOLOGY, (ORAL, ANAL, URETHRAL) ANCILLARY ONLY
Chlamydia: NEGATIVE
Comment: NEGATIVE
Comment: NEGATIVE
Comment: NORMAL
Neisseria Gonorrhea: NEGATIVE
Trichomonas: NEGATIVE

## 2020-05-28 ENCOUNTER — Encounter (HOSPITAL_COMMUNITY): Payer: Self-pay

## 2020-05-28 ENCOUNTER — Ambulatory Visit (HOSPITAL_COMMUNITY)
Admission: EM | Admit: 2020-05-28 | Discharge: 2020-05-28 | Disposition: A | Discharge status: Home / Self Care | Payer: Self-pay | Attending: Family Medicine | Admitting: Family Medicine

## 2020-05-28 DIAGNOSIS — R369 Urethral discharge, unspecified: Secondary | ICD-10-CM | POA: Insufficient documentation

## 2020-05-28 DIAGNOSIS — N4889 Other specified disorders of penis: Secondary | ICD-10-CM | POA: Insufficient documentation

## 2020-05-28 NOTE — ED Provider Notes (Signed)
[facility]    CSN: [id number] Arrival date & time: 05/28/20  1639      History   Chief Complaint Chief Complaint  Patient presents with  . Testicle Pain  . std testing    HPI Richard [name] is a 20 y.o. male.   Patient presenting today with acute on chronic intermittent penile discharge, occasional dysuria and stream interruption.  He states this has been an off-and-on issue since a chlamydia infection last year.  Has had the same partner since then and has had numerous negative STD tests in the past year since treatment for chlamydia.  Most recent screening was about 2 weeks ago which was negative though was empirically treated with IM Rocephin and doxycycline.  He states symptoms tend to worsen after intercourse but can happen anytime.  Denies rashes, scrotal swelling or pain, pelvic pain, abdominal pain, nausea vomiting diarrhea, fevers.  Not currently trying anything over-the-counter for symptoms.     Past Medical History:  Diagnosis Date  . Chlamydia   . Scoliosis   . Seizure (HCC) 03/20/2019    Patient Active Problem List   Diagnosis Date Noted  . Seizure-like activity (HCC) 03/21/2019  . Seizure (HCC) 03/20/2019    History reviewed. No pertinent surgical history.     Home Medications    Prior to Admission medications   Medication Sig Start Date End Date Taking? Authorizing Provider  doxycycline (VIBRAMYCIN) 100 MG capsule Take 1 capsule (100 mg total) by mouth 2 (two) times daily. 05/14/20   Particia Nearing, PA-C  gabapentin (NEURONTIN) 100 MG capsule Take 1 capsule (100 mg total) by mouth 3 (three) times daily. For anxiety or nerve pain 07/17/19 11/05/19  Eustace Moore, MD    Family History Family History  Problem Relation Age of Onset  . Sickle cell anemia Mother   . CVA Mother        CASUSED BY SICKLE CELL   . Seizures Mother        after cva  . Healthy Father     Social History Social History   Tobacco Use  . Smoking status:  Former Smoker    Packs/day: 0.20    Types: Cigarettes  . Smokeless tobacco: Never Used  . Tobacco comment: 2 cigs per day  Vaping Use  . Vaping Use: Never used  Substance Use Topics  . Alcohol use: Not Currently  . Drug use: Never     Allergies   Patient has no known allergies.   Review of Systems Review of Systems Per HPI  Physical Exam Triage Vital Signs ED Triage Vitals  Enc Vitals Group     BP 05/28/20 1704 123/70     Pulse Rate 05/28/20 1704 60     Resp 05/28/20 1704 17     Temp 05/28/20 1704 98.3 F (36.8 C)     Temp src --      SpO2 05/28/20 1704 100 %     Weight --      Height --      Head Circumference --      Peak Flow --      Pain Score 05/28/20 1703 5     Pain Loc --      Pain Edu? --      Excl. in GC? --    No data found.  Updated Vital Signs BP 123/70   Pulse 60   Temp 98.3 F (36.8 C)   Resp 17   SpO2 100%  Visual Acuity Right Eye Distance:   Left Eye Distance:   Bilateral Distance:    Right Eye Near:   Left Eye Near:    Bilateral Near:     Physical Exam Vitals and nursing note reviewed.  Constitutional:      Appearance: Normal appearance.  HENT:     Head: Atraumatic.     Mouth/Throat:     Mouth: Mucous membranes are moist.     Pharynx: Oropharynx is clear.  Eyes:     Extraocular Movements: Extraocular movements intact.     Conjunctiva/sclera: Conjunctivae normal.  Cardiovascular:     Rate and Rhythm: Normal rate and regular rhythm.  Pulmonary:     Effort: Pulmonary effort is normal.     Breath sounds: Normal breath sounds.  Abdominal:     General: Bowel sounds are normal. There is no distension.     Palpations: Abdomen is soft.     Tenderness: There is no abdominal tenderness. There is no right CVA tenderness, left CVA tenderness or guarding.  Genitourinary:    Comments: GU exam deferred, self swab performed Musculoskeletal:        General: Normal range of motion.     Cervical back: Normal range of motion and neck  supple.  Skin:    General: Skin is warm and dry.  Neurological:     General: No focal deficit present.     Mental Status: He is oriented to person, place, and time.  Psychiatric:        Mood and Affect: Mood normal.        Thought Content: Thought content normal.        Judgment: Judgment normal.      UC Treatments / Results  Labs (all labs ordered are listed, but only abnormal results are displayed) Labs Reviewed  CYTOLOGY, (ORAL, ANAL, URETHRAL) ANCILLARY ONLY    EKG   Radiology No results found.  Procedures Procedures (including critical care time)  Medications Ordered in UC Medications - No data to display  Initial Impression / Assessment and Plan / UC Course  I have reviewed the triage vital signs and the nursing notes.  Pertinent labs & imaging results that were available during my care of the patient were reviewed by me and considered in my medical decision making (see chart for details).     Given persistence and unclear etiology of symptoms, discussed importance of urology follow-up for further evaluation.  Will perform repeat cytology swab today but all testing has been negative the past year since treatment for chlamydia.  Abstinence discussed until results of swab return.  Call urology Monday morning for scheduling follow-up appointment.  Final Clinical Impressions(s) / UC Diagnoses   Final diagnoses:  Penile discharge  Penile pain   Discharge Instructions   None    ED Prescriptions    None     PDMP not reviewed this encounter.   Particia Nearing, [place] 05/28/20 1746

## 2020-05-28 NOTE — ED Triage Notes (Signed)
Pt in with penile discharge and testicle pain that started yesterday, requesting STD testing

## 2020-05-29 LAB — CYTOLOGY, (ORAL, ANAL, URETHRAL) ANCILLARY ONLY
Chlamydia: NEGATIVE
Comment: NEGATIVE
Comment: NEGATIVE
Comment: NORMAL
Neisseria Gonorrhea: NEGATIVE
Trichomonas: NEGATIVE

## 2020-06-08 ENCOUNTER — Other Ambulatory Visit: Payer: Self-pay

## 2020-06-08 ENCOUNTER — Ambulatory Visit
Admission: EM | Admit: 2020-06-08 | Discharge: 2020-06-08 | Disposition: A | Discharge status: Home / Self Care | Payer: Self-pay | Attending: Emergency Medicine | Admitting: Emergency Medicine

## 2020-06-08 DIAGNOSIS — R369 Urethral discharge, unspecified: Secondary | ICD-10-CM | POA: Insufficient documentation

## 2020-06-08 DIAGNOSIS — R300 Dysuria: Secondary | ICD-10-CM | POA: Insufficient documentation

## 2020-06-08 LAB — POCT URINALYSIS DIP (MANUAL ENTRY)
Bilirubin, UA: NEGATIVE
Blood, UA: NEGATIVE
Glucose, UA: NEGATIVE mg/dL
Ketones, POC UA: NEGATIVE mg/dL
Nitrite, UA: NEGATIVE
Protein Ur, POC: NEGATIVE mg/dL
Spec Grav, UA: >=1.03 — AB (ref 1.010–1.025)
Urobilinogen, UA: 0.2 E.U./dL
pH, UA: 6.5 (ref 5.0–8.0)

## 2020-06-08 MED ORDER — DOXYCYCLINE HYCLATE 100 MG PO CAPS: 100.0000 mg | ORAL_CAPSULE | Freq: Two times a day (BID) | ORAL | 0 refills | Status: AC

## 2020-06-08 NOTE — ED Triage Notes (Signed)
Pt states hx of chlamydia and having the same sx's. Pt c/o burning after he urinates x1wk. States now having pain to testicles and had white penile discharge this morning.

## 2020-06-08 NOTE — Discharge Instructions (Addendum)
Begin doxycycline twice daily for 1 week for chlamydia  We are testing you for Gonorrhea, Chlamydia and Trichomonas. We will call you if anything is positive and let you know if you require any further treatment. Please inform partner of any positive results.  Please return if symptoms not improving with treatment, development of fever, nausea, vomiting, abdominal pain, scrotal pain.

## 2020-06-09 LAB — CYTOLOGY, (ORAL, ANAL, URETHRAL) ANCILLARY ONLY
Chlamydia: NEGATIVE
Comment: NEGATIVE
Comment: NEGATIVE
Comment: NORMAL
Neisseria Gonorrhea: NEGATIVE
Trichomonas: NEGATIVE

## 2020-06-09 NOTE — ED Provider Notes (Signed)
[facility]    CSN: [id number] Arrival date & time: 06/08/20  1041      History   Chief Complaint Chief Complaint  Patient presents with  . SEXUALLY TRANSMITTED DISEASE    HPI Richard [name] is a 20 y.o. male presenting today for evaluation of dysuria and penile discharge.  Patient reports that recently he has had discomfort in his testicles.  In the past day or 2 he developed increased dysuria as well as penile discharge noted this morning.  Reports that he was recently treated for STDs and results returned negative, but would like to be rescreened as he now has more prominent symptoms which are similar to when he has previously had chlamydia.  He expresses concern over his partner accurately taking medicines to clear chlamydia infection.  HPI  Past Medical History:  Diagnosis Date  . Chlamydia   . Scoliosis   . Seizure (HCC) 03/20/2019    Patient Active Problem List   Diagnosis Date Noted  . Seizure-like activity (HCC) 03/21/2019  . Seizure (HCC) 03/20/2019    History reviewed. No pertinent surgical history.     Home Medications    Prior to Admission medications   Medication Sig Start Date End Date Taking? Authorizing Provider  doxycycline (VIBRAMYCIN) 100 MG capsule Take 1 capsule (100 mg total) by mouth 2 (two) times daily for 7 days. 06/08/20 06/15/20 Yes Fady Stamps C, PA-C  gabapentin (NEURONTIN) 100 MG capsule Take 1 capsule (100 mg total) by mouth 3 (three) times daily. For anxiety or nerve pain 07/17/19 11/05/19  Eustace Moore, MD    Family History Family History  Problem Relation Age of Onset  . Sickle cell anemia Mother   . CVA Mother        CASUSED BY SICKLE CELL   . Seizures Mother        after cva  . Healthy Father     Social History Social History   Tobacco Use  . Smoking status: Current Every Day Smoker    Packs/day: 0.20    Types: Cigarettes  . Smokeless tobacco: Never Used  . Tobacco comment: 2 cigs per day  Vaping Use   . Vaping Use: Never used  Substance Use Topics  . Alcohol use: Not Currently  . Drug use: Never     Allergies   Patient has no known allergies.   Review of Systems Review of Systems  Constitutional: Negative for fever.  HENT: Negative for sore throat.   Respiratory: Negative for shortness of breath.   Cardiovascular: Negative for chest pain.  Gastrointestinal: Negative for abdominal pain, nausea and vomiting.  Genitourinary: Positive for dysuria and penile discharge. Negative for difficulty urinating, frequency, penile pain, penile swelling, scrotal swelling and testicular pain.  Skin: Negative for rash.  Neurological: Negative for dizziness, light-headedness and headaches.     Physical Exam Triage Vital Signs ED Triage Vitals  Enc Vitals Group     BP 06/08/20 1250 (!) 126/91     Pulse Rate 06/08/20 1250 64     Resp 06/08/20 1250 18     Temp 06/08/20 1250 98.3 F (36.8 C)     Temp Source 06/08/20 1250 Oral     SpO2 06/08/20 1250 97 %     Weight --      Height --      Head Circumference --      Peak Flow --      Pain Score 06/08/20 1251 6     Pain  Loc --      Pain Edu? --      Excl. in GC? --    No data found.  Updated Vital Signs BP (!) 126/91 (BP Location: Left Arm)   Pulse 64   Temp 98.3 F (36.8 C) (Oral)   Resp 18   SpO2 97%   Visual Acuity Right Eye Distance:   Left Eye Distance:   Bilateral Distance:    Right Eye Near:   Left Eye Near:    Bilateral Near:     Physical Exam Vitals and nursing note reviewed.  Constitutional:      Appearance: He is well-developed.     Comments: No acute distress  HENT:     Head: Normocephalic and atraumatic.     Nose: Nose normal.  Eyes:     Conjunctiva/sclera: Conjunctivae normal.  Cardiovascular:     Rate and Rhythm: Normal rate.  Pulmonary:     Effort: Pulmonary effort is normal. No respiratory distress.  Abdominal:     General: There is no distension.  Genitourinary:    Comments: Shows picture of  white discharge at urethral meatus Musculoskeletal:        General: Normal range of motion.     Cervical back: Neck supple.  Skin:    General: Skin is warm and dry.  Neurological:     Mental Status: He is alert and oriented to person, place, and time.      UC Treatments / Results  Labs (all labs ordered are listed, but only abnormal results are displayed) Labs Reviewed  POCT URINALYSIS DIP (MANUAL ENTRY) - Abnormal; Notable for the following components:      Result Value   Spec Grav, UA >=1.030 (*)    Leukocytes, UA Trace (*)    All other components within normal limits  CYTOLOGY, (ORAL, ANAL, URETHRAL) ANCILLARY ONLY    EKG   Radiology No results found.  Procedures Procedures (including critical care time)  Medications Ordered in UC Medications - No data to display  Initial Impression / Assessment and Plan / UC Course  I have reviewed the triage vital signs and the nursing notes.  Pertinent labs & imaging results that were available during my care of the patient were reviewed by me and considered in my medical decision making (see chart for details).     Opting to go ahead and proceed with treatment for chlamydia with doxycycline, discussed with patient prior negative results and discussed if continuing to be negative and symptoms persisting despite treatment with doxycycline he needs to follow-up with urology for further evaluation.  Given discharge present did opt to go ahead and empirically treat.  Discussed strict return precautions. Patient verbalized understanding and is agreeable with plan.  Final Clinical Impressions(s) / UC Diagnoses   Final diagnoses:  Penile discharge  Dysuria     Discharge Instructions     Begin doxycycline twice daily for 1 week for chlamydia  We are testing you for Gonorrhea, Chlamydia and Trichomonas. We will call you if anything is positive and let you know if you require any further treatment. Please inform partner of any  positive results.  Please return if symptoms not improving with treatment, development of fever, nausea, vomiting, abdominal pain, scrotal pain.    ED Prescriptions    Medication Sig Dispense Auth. Provider   doxycycline (VIBRAMYCIN) 100 MG capsule Take 1 capsule (100 mg total) by mouth 2 (two) times daily for 7 days. 14 capsule Alisi Lupien, [place] C, PA-C  PDMP not reviewed this encounter.   Lew Dawes, [place] 06/09/20 [phone]

## 2020-09-06 ENCOUNTER — Emergency Department (HOSPITAL_COMMUNITY): Payer: Self-pay

## 2020-09-06 ENCOUNTER — Emergency Department (HOSPITAL_COMMUNITY)
Admission: EM | Admit: 2020-09-06 | Discharge: 2020-09-07 | Disposition: A | Discharge status: Home / Self Care | Payer: Self-pay | Attending: Emergency Medicine | Admitting: Emergency Medicine

## 2020-09-06 ENCOUNTER — Other Ambulatory Visit: Payer: Self-pay

## 2020-09-06 ENCOUNTER — Encounter (HOSPITAL_COMMUNITY): Payer: Self-pay | Admitting: Student

## 2020-09-06 DIAGNOSIS — F322 Major depressive disorder, single episode, severe without psychotic features: Secondary | ICD-10-CM | POA: Insufficient documentation

## 2020-09-06 DIAGNOSIS — L539 Erythematous condition, unspecified: Secondary | ICD-10-CM | POA: Insufficient documentation

## 2020-09-06 DIAGNOSIS — T1491XA Suicide attempt, initial encounter: Secondary | ICD-10-CM

## 2020-09-06 DIAGNOSIS — R45851 Suicidal ideations: Secondary | ICD-10-CM | POA: Insufficient documentation

## 2020-09-06 DIAGNOSIS — Z20822 Contact with and (suspected) exposure to covid-19: Secondary | ICD-10-CM | POA: Insufficient documentation

## 2020-09-06 DIAGNOSIS — F17210 Nicotine dependence, cigarettes, uncomplicated: Secondary | ICD-10-CM | POA: Insufficient documentation

## 2020-09-06 DIAGNOSIS — Y900 Blood alcohol level of less than 20 mg/100 ml: Secondary | ICD-10-CM | POA: Insufficient documentation

## 2020-09-06 DIAGNOSIS — R52 Pain, unspecified: Secondary | ICD-10-CM

## 2020-09-06 LAB — RESP PANEL BY RT-PCR (FLU A&B, COVID) ARPGX2
Influenza A by PCR: NEGATIVE
Influenza B by PCR: NEGATIVE
SARS Coronavirus 2 by RT PCR: NEGATIVE

## 2020-09-06 LAB — CBC
HCT: 35.7 % — ABNORMAL LOW (ref 39.0–52.0)
Hemoglobin: 13.1 g/dL (ref 13.0–17.0)
MCH: 32 pg (ref 26.0–34.0)
MCHC: 36.7 g/dL — ABNORMAL HIGH (ref 30.0–36.0)
MCV: 87.3 fL (ref 80.0–100.0)
Platelets: 232 10*3/uL (ref 150–400)
RBC: 4.09 MIL/uL — ABNORMAL LOW (ref 4.22–5.81)
RDW: 11.9 % (ref 11.5–15.5)
WBC: 6 10*3/uL (ref 4.0–10.5)
nRBC: 0 % (ref 0.0–0.2)

## 2020-09-06 LAB — COMPREHENSIVE METABOLIC PANEL
ALT: 11 U/L (ref 0–44)
AST: 21 U/L (ref 15–41)
Albumin: 4.8 g/dL (ref 3.5–5.0)
Alkaline Phosphatase: 52 U/L (ref 38–126)
Anion gap: 8 (ref 5–15)
BUN: 12 mg/dL (ref 6–20)
CO2: 27 mmol/L (ref 22–32)
Calcium: 9.6 mg/dL (ref 8.9–10.3)
Chloride: 101 mmol/L (ref 98–111)
Creatinine, Ser: 0.84 mg/dL (ref 0.61–1.24)
GFR, Estimated: >60 mL/min (ref >60)
Glucose, Bld: 122 mg/dL — ABNORMAL HIGH (ref 70–99)
Potassium: 2.9 mmol/L — ABNORMAL LOW (ref 3.5–5.1)
Sodium: 136 mmol/L (ref 135–145)
Total Bilirubin: 1.6 mg/dL — ABNORMAL HIGH (ref 0.3–1.2)
Total Protein: 7.6 g/dL (ref 6.5–8.1)

## 2020-09-06 LAB — SALICYLATE LEVEL: Salicylate Lvl: <7 mg/dL — ABNORMAL LOW (ref 7.0–30.0)

## 2020-09-06 LAB — ACETAMINOPHEN LEVEL: Acetaminophen (Tylenol), Serum: <10 ug/mL — ABNORMAL LOW (ref 10–30)

## 2020-09-06 LAB — ETHANOL: Alcohol, Ethyl (B): <10 mg/dL (ref <10)

## 2020-09-06 MED ORDER — ACETAMINOPHEN 325 MG PO TABS: 650.0000 mg | ORAL_TABLET | ORAL | Status: DC | PRN

## 2020-09-06 MED ORDER — IOHEXOL 350 MG/ML SOLN
100.0000 mL | Freq: Once | INTRAVENOUS | Status: AC | PRN
  Administered 2020-09-06: 100 mL via INTRAVENOUS

## 2020-09-06 MED ORDER — POTASSIUM CHLORIDE CRYS ER 20 MEQ PO TBCR
40.0000 meq | EXTENDED_RELEASE_TABLET | Freq: Once | ORAL | Status: AC
  Administered 2020-09-06: 40 meq via ORAL
  Filled 2020-09-06: qty 2

## 2020-09-06 MED ORDER — SODIUM CHLORIDE (PF) 0.9 % IJ SOLN
INTRAMUSCULAR | Status: AC
  Filled 2020-09-06: qty 50

## 2020-09-06 MED ORDER — ZOLPIDEM TARTRATE 5 MG PO TABS: 5.0000 mg | ORAL_TABLET | Freq: Every evening | ORAL | Status: DC | PRN

## 2020-09-06 MED ORDER — ALUM & MAG HYDROXIDE-SIMETH 200-200-20 MG/5ML PO SUSP: 30.0000 mL | Freq: Four times a day (QID) | ORAL | Status: DC | PRN

## 2020-09-06 MED ORDER — ONDANSETRON HCL 4 MG PO TABS: 4.0000 mg | ORAL_TABLET | Freq: Three times a day (TID) | ORAL | Status: DC | PRN

## 2020-09-06 MED ORDER — POTASSIUM CHLORIDE CRYS ER 20 MEQ PO TBCR
20.0000 meq | EXTENDED_RELEASE_TABLET | Freq: Two times a day (BID) | ORAL | Status: DC
  Administered 2020-09-07: 20 meq via ORAL
  Filled 2020-09-06: qty 1

## 2020-09-06 MED ORDER — NICOTINE 21 MG/24HR TD PT24: 21.0000 mg | MEDICATED_PATCH | Freq: Every day | TRANSDERMAL | Status: DC

## 2020-09-06 NOTE — ED Provider Notes (Signed)
[facility]-EMERGENCY DEPT Provider Note   CSN: [id number] Arrival date & time: 09/06/20  1735     History Chief Complaint  Patient presents with   Suicide Attempt    Richard [name] is a 20 y.o. male with a history of seizures who presents to the emergency department via police/EMS status post suicide attempt.  History primarily provided by EMS, they relate that patient's family was concerned about him earlier today, they had not heard back from him therefore they went to his place of residence to check on him and found him hanging with a belt.  He was not responsive on their arrival, they got him down and called 911.  Per EMS patient had good color, he was responsive to sternal rub, did not have any stridor on scene.  He did become agitated and tried to run away, he was given 5 mg of IM Haldol and placed in restraints.  Patient does admit that he attempted suicide earlier, he states he is been having a hard time with a recent break-up, he has not had any homicidal ideation or hallucinations.  He denies any pain or trouble breathing.  He denies any drug or alcohol use.  HPI     Past Medical History:  Diagnosis Date   Chlamydia    Scoliosis    Seizure (HCC) 03/20/2019    Patient Active Problem List   Diagnosis Date Noted   Seizure-like activity (HCC) 03/21/2019   Seizure (HCC) 03/20/2019    History reviewed. No pertinent surgical history.     Family History  Problem Relation Age of Onset   Sickle cell anemia Mother    CVA Mother        CASUSED BY SICKLE CELL    Seizures Mother        after cva   Healthy Father     Social History   Tobacco Use   Smoking status: Every Day    Packs/day: 0.20    Pack years: 0.00    Types: Cigarettes   Smokeless tobacco: Never   Tobacco comments:    2 cigs per day  Vaping Use   Vaping Use: Never used  Substance Use Topics   Alcohol use: Not Currently   Drug use: Never    Home Medications Prior to Admission  medications   Medication Sig Start Date End Date Taking? Authorizing Provider  gabapentin (NEURONTIN) 100 MG capsule Take 1 capsule (100 mg total) by mouth 3 (three) times daily. For anxiety or nerve pain 07/17/19 11/05/19  Eustace Moore, MD    Allergies    Patient has no known allergies.  Review of Systems   Review of Systems  Constitutional:  Negative for chills and fever.  Respiratory:  Negative for shortness of breath.   Cardiovascular:  Negative for chest pain.  Gastrointestinal:  Negative for abdominal pain.  Musculoskeletal:  Negative for back pain and neck pain.  Psychiatric/Behavioral:  Positive for self-injury and suicidal ideas. Negative for hallucinations.   All other systems reviewed and are negative.  Physical Exam Updated Vital Signs BP 130/84   Pulse (!) 52   Resp 16   SpO2 99%  Temp: 98.9 orally.   Physical Exam Vitals and nursing note reviewed.  Constitutional:      General: He is not in acute distress. HENT:     Head: Normocephalic and atraumatic.     Comments: No raccoon eyes. Eyes:     Extraocular Movements: Extraocular movements intact.  Pupils: Pupils are equal, round, and reactive to light.  Neck:     Comments: Mild area of faint erythema to the left anterior neck.  No hematoma.  Trachea midline.  No tenderness to palpation throughout. Pulmonary:     Effort: Pulmonary effort is normal.     Breath sounds: Normal breath sounds. No stridor.  Chest:     Chest wall: No tenderness.  Abdominal:     General: There is no distension.     Palpations: Abdomen is soft.     Tenderness: There is no abdominal tenderness. There is no guarding or rebound.  Musculoskeletal:     Cervical back: Neck supple.     Comments: No focal tenderness throughout extremities.  No midline tenderness throughout the spine.  Skin:    General: Skin is warm and dry.  Neurological:     Mental Status: He is alert.     Comments: Alert.  Clear speech.  Sensation and strength  grossly intact x4.  Psychiatric:        Mood and Affect: Mood is depressed.        Thought Content: Thought content includes suicidal ideation.    ED Results / Procedures / Treatments   Labs (all labs ordered are listed, but only abnormal results are displayed) Labs Reviewed  COMPREHENSIVE METABOLIC PANEL - Abnormal; Notable for the following components:      Result Value   Potassium 2.9 (*)    Glucose, Bld 122 (*)    Total Bilirubin 1.6 (*)    All other components within normal limits  CBC - Abnormal; Notable for the following components:   RBC 4.09 (*)    HCT 35.7 (*)    MCHC 36.7 (*)    All other components within normal limits  ACETAMINOPHEN LEVEL - Abnormal; Notable for the following components:   Acetaminophen (Tylenol), Serum <10 (*)    All other components within normal limits  SALICYLATE LEVEL - Abnormal; Notable for the following components:   Salicylate Lvl <7.0 (*)    All other components within normal limits  RESP PANEL BY RT-PCR (FLU A&B, COVID) ARPGX2  ETHANOL  RAPID URINE DRUG SCREEN, HOSP PERFORMED    EKG None  Radiology CT Angio Head W/Cm &/Or Wo Cm  Result Date: 09/06/2020 CLINICAL DATA:  Initial evaluation for acute trauma, attempted hanging. EXAM: CT ANGIOGRAPHY HEAD AND NECK TECHNIQUE: Multidetector CT imaging of the head and neck was performed using the standard protocol during bolus administration of intravenous contrast. Multiplanar CT image reconstructions and MIPs were obtained to evaluate the vascular anatomy. Carotid stenosis measurements (when applicable) are obtained utilizing NASCET criteria, using the distal internal carotid diameter as the denominator. CONTRAST:  100mL OMNIPAQUE IOHEXOL 350 MG/ML SOLN COMPARISON:  None. FINDINGS: CT HEAD FINDINGS Brain: Cerebral volume within normal limits for patient age. No evidence for acute intracranial hemorrhage. No findings to suggest acute large vessel territory infarct. No mass lesion, midline shift, or  mass effect. Ventricles are normal in size without evidence for hydrocephalus. No extra-axial fluid collection identified. Vascular: No hyperdense vessel identified. Skull: Scalp soft tissues demonstrate no acute abnormality. Calvarium intact. Sinuses/Orbits: Globes and orbital soft tissues within normal limits. Visualized paranasal sinuses are clear. No mastoid effusion. CTA NECK FINDINGS Aortic arch: Visualized aortic arch normal in caliber with normal branch pattern. No stenosis or other abnormality about the origin of the great vessels. Right carotid system: Right common and internal carotid arteries widely patent without stenosis, dissection or occlusion. Left carotid  system: Left common and internal carotid arteries widely patent without stenosis, dissection or occlusion. Vertebral arteries: Left vertebral artery arises directly from the aortic arch. Right vertebral artery dominant. Vertebral arteries widely patent without stenosis, dissection or occlusion. Skeleton: No visible acute osseous finding. No discrete or worrisome osseous lesions. Prominent thoracic scoliosis partially visualized. Other neck: No other acute soft tissue abnormality within the neck. No visible mass or adenopathy. Upper chest: Unremarkable. Review of the MIP images confirms the above findings CTA HEAD FINDINGS Anterior circulation: Both internal carotid arteries widely patent to the termini without stenosis. A1 segments widely patent. Normal anterior communicating artery complex. Both anterior cerebral arteries widely patent to their distal aspects without stenosis. No M1 stenosis or occlusion. Normal MCA bifurcations. Distal MCA branches well perfused and symmetric. Posterior circulation: Both V4 segments patent to the vertebrobasilar junction without stenosis. Both PICA origins patent and normal. Basilar widely patent to its distal aspect without stenosis. Superior cerebellar arteries patent bilaterally. Both PCAs primarily supplied  via the basilar and are well perfused to there distal aspects. Venous sinuses: Grossly patent allowing for timing the contrast bolus. Anatomic variants: None significant.  No aneurysm. Review of the MIP images confirms the above findings IMPRESSION: 1. Normal CTA of the head and neck. No acute traumatic vascular injury identified. 2. No other acute intracranial abnormality. Electronically Signed   By: Rise Mu M.D.   On: 09/06/2020 22:23   CT Angio Neck W and/or Wo Contrast  Result Date: 09/06/2020 CLINICAL DATA:  Initial evaluation for acute trauma, attempted hanging. EXAM: CT ANGIOGRAPHY HEAD AND NECK TECHNIQUE: Multidetector CT imaging of the head and neck was performed using the standard protocol during bolus administration of intravenous contrast. Multiplanar CT image reconstructions and MIPs were obtained to evaluate the vascular anatomy. Carotid stenosis measurements (when applicable) are obtained utilizing NASCET criteria, using the distal internal carotid diameter as the denominator. CONTRAST:  OMNIPAQUE IOHEXOL 350 MG/ML SOLN COMPARISON:  None. FINDINGS: CT HEAD FINDINGS Brain: Cerebral volume within normal limits for patient age. No evidence for acute intracranial hemorrhage. No findings to suggest acute large vessel territory infarct. No mass lesion, midline shift, or mass effect. Ventricles are normal in size without evidence for hydrocephalus. No extra-axial fluid collection identified. Vascular: No hyperdense vessel identified. Skull: Scalp soft tissues demonstrate no acute abnormality. Calvarium intact. Sinuses/Orbits: Globes and orbital soft tissues within normal limits. Visualized paranasal sinuses are clear. No mastoid effusion. CTA NECK FINDINGS Aortic arch: Visualized aortic arch normal in caliber with normal branch pattern. No stenosis or other abnormality about the origin of the great vessels. Right carotid system: Right common and internal carotid arteries widely patent  without stenosis, dissection or occlusion. Left carotid system: Left common and internal carotid arteries widely patent without stenosis, dissection or occlusion. Vertebral arteries: Left vertebral artery arises directly from the aortic arch. Right vertebral artery dominant. Vertebral arteries widely patent without stenosis, dissection or occlusion. Skeleton: No visible acute osseous finding. No discrete or worrisome osseous lesions. Prominent thoracic scoliosis partially visualized. Other neck: No other acute soft tissue abnormality within the neck. No visible mass or adenopathy. Upper chest: Unremarkable. Review of the MIP images confirms the above findings CTA HEAD FINDINGS Anterior circulation: Both internal carotid arteries widely patent to the termini without stenosis. A1 segments widely patent. Normal anterior communicating artery complex. Both anterior cerebral arteries widely patent to their distal aspects without stenosis. No M1 stenosis or occlusion. Normal MCA bifurcations. Distal MCA branches well perfused and symmetric.  Posterior circulation: Both V4 segments patent to the vertebrobasilar junction without stenosis. Both PICA origins patent and normal. Basilar widely patent to its distal aspect without stenosis. Superior cerebellar arteries patent bilaterally. Both PCAs primarily supplied via the basilar and are well perfused to there distal aspects. Venous sinuses: Grossly patent allowing for timing the contrast bolus. Anatomic variants: None significant.  No aneurysm. Review of the MIP images confirms the above findings IMPRESSION: 1. Normal CTA of the head and neck. No acute traumatic vascular injury identified. 2. No other acute intracranial abnormality. Electronically Signed   By: Rise Mu M.D.   On: 09/06/2020 22:23   CT C-SPINE NO CHARGE  Result Date: 09/06/2020 CLINICAL DATA:  Trauma EXAM: CT CERVICAL SPINE WITHOUT CONTRAST TECHNIQUE: Multidetector CT imaging of the cervical  spine was acquired from previously performed CT angiography of the neck. No additional contrast administered. Multiplanar CT image reconstructions were also generated. COMPARISON:  CT 02/16/2018 FINDINGS: Alignment: Mild reversal of cervical lordosis.  No subluxation. Skull base and vertebrae: Vertebral body heights are maintained. Mild irregularity of C4 and C5 superior endplates is likely chronic. Corticated osseous density at the anterior superior corner C6 with sclerotic margin appears chronic Soft tissues and spinal canal: No prevertebral fluid or swelling. No visible canal hematoma. Disc levels: Disc spaces are patent. No abnormal widening or narrowing. Upper chest: Negative. Other: None IMPRESSION: Mild reversal of cervical lordosis.  No acute osseous abnormality. Electronically Signed   By: Jasmine Pang M.D.   On: 09/06/2020 21:55    Procedures Procedures   Medications Ordered in ED Medications - No data to display  ED Course  I have reviewed the triage vital signs and the nursing notes.  Pertinent labs & imaging results that were available during my care of the patient were reviewed by me and considered in my medical decision making (see chart for details).    MDM Rules/Calculators/A&P                         Patient presents to the ED following suicide attempt. Vitals without significant abnormality.  Overall with depressed mood, has intermittent episodes of agitation therefore restraints remaining in place at this time.   Additional history obtained: Additional history obtained from chart review & nursing note review.   Lab Tests: I Ordered, reviewed, and interpreted labs, which included:  CBC: Unremarkable CMP: Mild hypokalemia Ethanol/salicylate/acetaminophen levels: Within normal limits COVID/flu testing: Negative   Imaging Studies ordered: I ordered imaging studies which included CTA head/neck & CT c spine wo contrast, I independently reviewed, formal radiology  impression shows:  CTA: . Normal CTA of the head and neck. No acute traumatic vascular injury identified. 2. No other acute intracranial abnormality CT C spine: Mild reversal of cervical lordosis.  No acute osseous abnormality.   ED Course:  CT imaging overall reassuring.  No other signs of injury on exam.  Labs with mild hypokalemia which will be orally replaced.  Reassessment earlier this evening patient was appearing more calm and cooperative, restraints were removed.  Patient medically cleared.  Consult placed to TTS.  Disposition per behavioral health.  Patient is currently voluntary, however should he attempt to leave would need to IVC.  The patient has been placed in psychiatric observation due to the need to provide a safe environment for the patient while obtaining psychiatric consultation and evaluation, as well as ongoing medical and medication management to treat the patient's condition.  The patient has  not been placed under full IVC at this time.  Portions of this note were generated with Scientist, clinical (histocompatibility and immunogenetics). Dictation errors may occur despite best attempts at proofreading.  Final Clinical Impression(s) / ED Diagnoses Final diagnoses:  Suicide attempt [facility])    Rx / DC Orders ED Discharge Orders     None        Cherly Anderson, PA-C 09/06/20 2320    Rolan Bucco, MD 09/06/20 2336

## 2020-09-06 NOTE — ED Triage Notes (Signed)
Pt ws found by family hanging by his neck with a belt. Family got him down. When pt regained consciousness, he attempted to jump out of a 3rd story window with EMS and [employer] present. Pt arriving in handcuffs and ankle restraints. Pt received sedative via EMS. Pt alert and cooperative at this time.

## 2020-09-06 NOTE — ED Notes (Signed)
Pt speaking with TTS 

## 2020-09-06 NOTE — ED Notes (Signed)
PD at bedside. IV placed for CT scans

## 2020-09-06 NOTE — ED Notes (Signed)
Pt to CT via stretcher

## 2020-09-06 NOTE — ED Notes (Signed)
Restraints discontinued. Pt is resting in bed with eyes closed. PD at bedside. Awaiting sitter for pt. Pt is calm and cooperative at present

## 2020-09-06 NOTE — ED Notes (Addendum)
Pt was changed into burgundy scrubs. Pt belongings are at nurse desk in cabinet. Pt had one bag with red shorts in it. Pt did not come in with shoes.

## 2020-09-06 NOTE — ED Notes (Signed)
Pt unable to sign MSE 

## 2020-09-07 ENCOUNTER — Inpatient Hospital Stay (HOSPITAL_COMMUNITY)
Admission: AD | Admit: 2020-09-07 | Discharge: 2020-09-12 | DRG: 885 | Disposition: A | Discharge status: Home / Self Care | Payer: No Typology Code available for payment source | Source: Intra-hospital | Attending: Emergency Medicine | Admitting: Emergency Medicine

## 2020-09-07 ENCOUNTER — Encounter (HOSPITAL_COMMUNITY): Payer: Self-pay | Admitting: Family Medicine

## 2020-09-07 DIAGNOSIS — X838XXA Intentional self-harm by other specified means, initial encounter: Secondary | ICD-10-CM

## 2020-09-07 DIAGNOSIS — F17210 Nicotine dependence, cigarettes, uncomplicated: Secondary | ICD-10-CM | POA: Diagnosis present

## 2020-09-07 DIAGNOSIS — F322 Major depressive disorder, single episode, severe without psychotic features: Principal | ICD-10-CM

## 2020-09-07 DIAGNOSIS — T1491XA Suicide attempt, initial encounter: Secondary | ICD-10-CM | POA: Diagnosis present

## 2020-09-07 DIAGNOSIS — T71162A Asphyxiation due to hanging, intentional self-harm, initial encounter: Secondary | ICD-10-CM | POA: Insufficient documentation

## 2020-09-07 LAB — RAPID URINE DRUG SCREEN, HOSP PERFORMED
Amphetamines: NOT DETECTED
Barbiturates: NOT DETECTED
Benzodiazepines: NOT DETECTED
Cocaine: NOT DETECTED
Opiates: NOT DETECTED
Tetrahydrocannabinol: NOT DETECTED

## 2020-09-07 MED ORDER — ACETAMINOPHEN 325 MG PO TABS: 650.0000 mg | ORAL_TABLET | Freq: Four times a day (QID) | ORAL | Status: DC | PRN

## 2020-09-07 MED ORDER — POTASSIUM CHLORIDE CRYS ER 20 MEQ PO TBCR
20.0000 meq | EXTENDED_RELEASE_TABLET | Freq: Two times a day (BID) | ORAL | Status: DC
  Administered 2020-09-07 – 2020-09-08 (×2): 20 meq via ORAL
  Filled 2020-09-07 (×6): qty 1

## 2020-09-07 MED ORDER — MAGNESIUM HYDROXIDE 400 MG/5ML PO SUSP: 30.0000 mL | Freq: Every day | ORAL | Status: DC | PRN

## 2020-09-07 MED ORDER — NICOTINE 21 MG/24HR TD PT24
21.0000 mg | MEDICATED_PATCH | Freq: Every day | TRANSDERMAL | Status: DC
  Filled 2020-09-07 (×7): qty 1

## 2020-09-07 MED ORDER — TRAZODONE HCL 50 MG PO TABS
50.0000 mg | ORAL_TABLET | Freq: Every evening | ORAL | Status: DC | PRN
  Administered 2020-09-10 – 2020-09-11 (×2): 50 mg via ORAL
  Filled 2020-09-07: qty 7
  Filled 2020-09-07 (×2): qty 1

## 2020-09-07 MED ORDER — ALUM & MAG HYDROXIDE-SIMETH 200-200-20 MG/5ML PO SUSP: 30.0000 mL | ORAL | Status: DC | PRN

## 2020-09-07 NOTE — ED Notes (Signed)
Pt aware of need for urine sample.  

## 2020-09-07 NOTE — BH Assessment (Signed)
Comprehensive Clinical Assessment (CCA) Note  09/07/2020 Richard [name] [id number]  Discharge Disposition: Nira Conn, NP, reviewed pt's chart and information and determined pt mees inpatient criteria. [facility] AC Fransico Michael, RN, report there is currently no appropriate bed for pt and that his referral information will need to be faxed out by SW for potential placement. This information was relayed to pt's providers at 0013.  The patient demonstrates the following risk factors for suicide: Chronic risk factors for suicide include: psychiatric disorder of MDD, previous suicide attempts tonight, and demographic factors (male, >22 y/o). Acute risk factors for suicide include: family or marital conflict. Protective factors for this patient include: positive social support and responsibility to others (children, family). Considering these factors, the overall suicide risk at this point appears to be high. Patient is not appropriate for outpatient follow up.  Therefore, a 1:1 sitter is recommended for suicide prevention.  Flowsheet Row ED from 09/06/2020 in [facility] from 06/08/2020 in [facility] at [facility]  ED from 05/28/2020 in [facility] RISK CATEGORY High Risk No Risk Error: Question 6 not populated      Chief Complaint:  Chief Complaint  Patient presents with   Suicide Attempt   Visit Diagnosis: F32.2, Major depressive disorder, Single episode, Severe   CCA Screening, Triage and Referral (STR) Richard [name] is a 20 year old patient who was brought to the [facility] via EMS after pt attempted to kill himself by hanging a belt around his neck; pt's uncle found him and removed the belt. When pt regained consciousness, pt attempted to jump out of a 3rd story window, an incident that was witnessed by [facility] and EMS. Pt is currently in the hospital under IVC. Pt states, "I tried to kill myself today," and stated it was due to his  relationship.  Pt denies current SI or a current plan to kill himself. Pt denies any prior SI or any prior attempts to kil himself (other than tonight). Pt denies any prior hospitalizations for mental health concerns. Pt denies HI, AVH, NSSIB, access to guns/weapons, engagement with the legal system, or SA.  Pt is oriented x5. His recent/remote memory is intact. Pt was guarded throughout the assessment; pt was also sleepy and required questions being repeated several times. Pt's insight, judgement, and impulse control is poor at this time.   Patient Reported Information How did you hear about [place]? Other (Comment) (EDP)  What Is the Reason for Your Visit/Call Today? Pt states, "I tried to kill myself today." Pt reports he has never attempted to kill himself in the past and that he did this because of his relationship.  How Long Has This Been Causing You Problems? <Week  What Do You Feel Would Help You the Most Today? Treatment for Depression or other mood problem   Have You Recently Had Any Thoughts About Hurting Yourself? Yes  Are You Planning to Commit Suicide/Harm Yourself At This time? No (Note: Pt attempted to kill himself earlier today)   Have you Recently Had Thoughts About Hurting Someone Karolee Ohs? No  Are You Planning to Harm Someone at This Time? No  Explanation: No data recorded  Have You Used Any Alcohol or Drugs in the Past 24 Hours? No  How Long Ago Did You Use Drugs or Alcohol? No data recorded What Did You Use and How Much? No data recorded  Do You Currently Have a Therapist/Psychiatrist? No  Name of Therapist/Psychiatrist: No data  recorded  Have You Been Recently Discharged From Any Office Practice or Programs? No  Explanation of Discharge From Practice/Program: No data recorded    CCA Screening Triage Referral Assessment Type of Contact: Tele-Assessment  Telemedicine Service Delivery: Telemedicine service delivery: This service was provided via telemedicine  using a 2-way, interactive audio and video technology  Is this Initial or Reassessment? Initial Assessment  Date Telepsych consult ordered in [facility]:  09/06/20  Time Telepsych consult ordered in [facility]:  2305  Location of Assessment: [facility] ED  Provider Location: [facility] Assessment Services   Collateral Involvement: Pt declined to provide verbal consent for clinician to make contact with friends/family members for collateral.   Does Patient Have a Court Appointed Legal Guardian? No data recorded Name and Contact of Legal Guardian: No data recorded If Minor and Not Living with Parent(s), Who has Custody? N/A  Is CPS involved or ever been involved? Never  Is APS involved or ever been involved? Never   Patient Determined To Be At Risk for Harm To Self or Others Based on Review of Patient Reported Information or Presenting Complaint? Yes, for Self-Harm  Method: No data recorded Availability of Means: No data recorded Intent: No data recorded Notification Required: No data recorded Additional Information for Danger to Others Potential: No data recorded Additional Comments for Danger to Others Potential: No data recorded Are There Guns or Other Weapons in Your Home? No data recorded Types of Guns/Weapons: No data recorded Are These Weapons Safely Secured?                            No data recorded Who Could Verify You Are Able To Have These Secured: No data recorded Do You Have any Outstanding Charges, Pending Court Dates, Parole/Probation? No data recorded Contacted To Inform of Risk of Harm To Self or Others: Law Enforcement Mudlogger is aware, as are several of pt's family members)    Does Patient Present under Involuntary Commitment? Yes  IVC Papers Initial File Date: 09/06/20   [place] of Residence: [place]   Patient Currently Receiving the Following Services: Not Receiving Services   Determination of Need: Emergent (2 hours)   Options For Referral: Inpatient  Hospitalization     CCA Biopsychosocial Patient Reported Schizophrenia/Schizoaffective Diagnosis in Past: No   Strengths: Pt is the father of a 80-month-old daughter named Richard [name]. Pt is close with his mother and grandfather.   Mental Health Symptoms Depression:   Hopelessness   Duration of Depressive symptoms:  Duration of Depressive Symptoms: Less than two weeks   Mania:   None   Anxiety:    Worrying   Psychosis:   None   Duration of Psychotic symptoms:    Trauma:   None   Obsessions:   None   Compulsions:   None   Inattention:   None   Hyperactivity/Impulsivity:   None   Oppositional/Defiant Behaviors:   None   Emotional Irregularity:   Intense/unstable relationships; Potentially harmful impulsivity   Other Mood/Personality Symptoms:   None noted    Mental Status Exam Appearance and self-care  Stature:   Average   Weight:   Average weight   Clothing:   -- (Pt is dressed in scrubs)   Grooming:   Normal   Cosmetic use:   None   Posture/gait:   Normal   Motor activity:   Not Remarkable   Sensorium  Attention:   Inattentive (Pt was sleepy throughout the  assessment)   Concentration:   Variable (Pt was sleepy throughout the assessment)   Orientation:   X5   Recall/memory:   Normal   Affect and Mood  Affect:   Blunted; Flat; Depressed   Mood:   Depressed   Relating  Eye contact:   Avoided   Facial expression:   Sad   Attitude toward examiner:   Guarded   Thought and Language  Speech flow:  Slow   Thought content:   Appropriate to Mood and Circumstances   Preoccupation:   None   Hallucinations:   None   Organization:  No data recorded  [employer] of Knowledge:   Average   Intelligence:   Average   Abstraction:   Normal   Judgement:   Impaired   Reality Testing:   Adequate   Insight:   Lacking   Decision Making:   Impulsive   Social Functioning  Social Maturity:    Impulsive   Social Judgement:   Naive   Stress  Stressors:   Relationship   Coping Ability:   Overwhelmed   Skill Deficits:   Decision making; Interpersonal   Supports:   Family     Religion: Religion/Spirituality Are You A Religious Person?:  (Not assessed) How Might This Affect Treatment?: Not assessed  Leisure/Recreation: Leisure / Recreation Do You Have Hobbies?:  (Not assessed)  Exercise/Diet: Exercise/Diet Do You Exercise?:  (Not assessed) Have You Gained or Lost A Significant Amount of Weight in the Past Six Months?:  (Not assessed) Do You Follow a Special Diet?:  (Not assessed) Do You Have Any Trouble Sleeping?:  (Not assessed)   CCA Employment/Education Employment/Work Situation: Employment / Work Situation Employment Situation: Employed Work Stressors: Pt would not specify Patient's Job has Been Impacted by Current Illness:  (Not assessed) Has Patient ever Been in the [employer]?:  (Not assessed)  Education: Education Is Patient Currently Attending School?: No Last Grade Completed:  (Not assessed) Did Theme park manager?:  (Not assessed) Did You Have An Individualized Education Program (IIEP):  (Not assessed) Did You Have Any Difficulty At School?:  (Not assessed) Patient's Education Has Been Impacted by Current Illness:  (Not assessed)   CCA Family/Childhood History Family and Relationship History: Family history Marital status: Long term relationship Long term relationship, how long?: Not assessed What types of issues is patient dealing with in the relationship?: Pt would not specify Additional relationship information: Pt has a 72-month old daughter with his partner Does patient have children?: Yes How many children?: 1 How is patient's relationship with their children?: Pt's daughter, Richard [name], is 1 months old  Childhood History:  Childhood History By whom was/is the patient raised?:  (Not assessed) Did patient suffer any  verbal/emotional/physical/sexual abuse as a child?:  (Not assessed) Did patient suffer from severe childhood neglect?:  (Not assessed) Has patient ever been sexually abused/assaulted/raped as an adolescent or adult?:  (Not assessed) Was the patient ever a victim of a crime or a disaster?:  (Not assessed) Witnessed domestic violence?:  (Not assessed) Has patient been affected by domestic violence as an adult?:  (Not assessed)  Child/Adolescent Assessment:     CCA Substance Use Alcohol/Drug Use: Alcohol / Drug Use Pain Medications: See MAR Prescriptions: See MAR Over the Counter: See MAR History of alcohol / drug use?: No history of alcohol / drug abuse Longest period of sobriety (when/how long): N/A Negative Consequences of Use:  (N/A) Withdrawal Symptoms:  (N/A)  ASAM's:  Six Dimensions of Multidimensional Assessment  Dimension 1:  Acute Intoxication and/or Withdrawal Potential:      Dimension 2:  Biomedical Conditions and Complications:      Dimension 3:  Emotional, Behavioral, or Cognitive Conditions and Complications:     Dimension 4:  Readiness to Change:     Dimension 5:  Relapse, Continued use, or Continued Problem Potential:     Dimension 6:  Recovery/Living Environment:     ASAM Severity Score:    ASAM Recommended Level of Treatment: ASAM Recommended Level of Treatment:  (N/A)   Substance use Disorder (SUD) Substance Use Disorder (SUD)  Checklist Symptoms of Substance Use:  (N/A)  Recommendations for Services/Supports/Treatments: Recommendations for Services/Supports/Treatments Recommendations For Services/Supports/Treatments: Inpatient Hospitalization  Discharge Disposition: Nira ConnJason Berry, NP, reviewed pt's chart and information and determined pt mees inpatient criteria. [facility] MichaelKim Brooks, RN, report there is currently no appropriate bed for pt and that his referral information will need to be faxed out by SW for potential  placement. This information was relayed to pt's providers at 0013.  DSM5 Diagnoses: Patient Active Problem List   Diagnosis Date Noted   Seizure-like activity (HCC) 03/21/2019   Seizure (HCC) 03/20/2019     Referrals to Alternative Service(s): Referred to Alternative Service(s):   Place:   Date:   Time:    Referred to Alternative Service(s):   Place:   Date:   Time:    Referred to Alternative Service(s):   Place:   Date:   Time:    Referred to Alternative Service(s):   Place:   Date:   Time:     Ralph DowdySamantha L Eathan Groman, LMFT

## 2020-09-07 NOTE — BH Assessment (Signed)
B[facility] Assessment Progress Note   Per Nira Conn, NP, this pt requires psychiatric hospitalization at this time.  Linsey, RN, [facility] has assigned pt to [facility] Rm 401-2 to the service of Dr Mason Jim.  [facility] will be ready to receive pt between 11:00 and 11:30.  Pt has signed Voluntary Admission and Consent for Treatment, as well as Consent to Release Information to no one, and signed forms have been faxed to [facility].  EDP Kristine Royal, MD and pt's nurse, Jonny Ruiz, have been notified, and Jonny Ruiz agrees to send original paperwork along with pt via [employer], and to call report to [phone].  Doylene Canning, [place] Behavioral Health Coordinator [phone]

## 2020-09-07 NOTE — ED Notes (Signed)
Previous entry documented error.

## 2020-09-07 NOTE — Progress Notes (Signed)
Richard [name] is a 20 year old male being admitted to [facility] from [facility].  He was brought to the ED via EMS for a suicide attempt.  He was found by his uncle with a belt around his neck.  He then tried to jump out a 3 story window.  During [facility] admission, he was guarded.  He was minimal with his answers.  He stated "I didn't try to hurt myself with the belt, I just had it around my neck."  He would not comment when this writer asked about trying to jump out the window.  He is minimizing his symptoms.  He denied SI/H or AVH.  He denied any pain or discomfort and appeared to be in no physical distress.  Oriented him to the unit.  Admission paperwork completed and signed.  Suicide safety plan reviewed, given to patient to complete and return to his nurse.  Belongings searched and secured in locker # 30, no contraband found.  Skin assessment completed and no skin issues noted.  Q 15 minute checks initiated for safety.  We will continue to monitor the progress towards his goals.

## 2020-09-07 NOTE — Progress Notes (Signed)
Psychoeducational Group Note  Date:  09/07/2020 Time:  2238  Group Topic/Focus:  Wrap-Up Group:   The focus of this group is to help patients review their daily goal of treatment and discuss progress on daily workbooks.  Participation Level: Did Not Attend  Participation Quality:  Not Applicable  Affect:  Not Applicable  Cognitive:  Not Applicable  Insight:  Not Applicable  Engagement in Group: Not Applicable  Additional Comments:  Patient did not attend group this evening.   Hazle Coca S 09/07/2020, 10:39 PM

## 2020-09-07 NOTE — Tx Team (Signed)
Initial Treatment Plan 09/07/2020 3:10 PM [name] [id number]    PATIENT STRESSORS: Marital or family conflict Occupational concerns   PATIENT STRENGTHS: General fund of knowledge Physical Health   PATIENT IDENTIFIED PROBLEMS: Depression  Suicidal ideation    "Help me with my stress"               DISCHARGE CRITERIA:  Improved stabilization in mood, thinking, and/or behavior Need for constant or close observation no longer present Reduction of life-threatening or endangering symptoms to within safe limits Verbal commitment to aftercare and medication compliance  PRELIMINARY DISCHARGE PLAN: Outpatient therapy  PATIENT/FAMILY INVOLVEMENT: This treatment plan has been presented to and reviewed with the patient, Richard [name].  The patient and family have been given the opportunity to ask questions and make suggestions.  Levin Bacon, RN 09/07/2020, 3:10 PM

## 2020-09-07 NOTE — BHH Group Notes (Signed)
Type of Therapy and Topic:  Group Therapy:  Healthy and Unhealthy Supports   Participation Level:  Active    Description of Group:  Patients in this group were introduced to the idea of adding a variety of healthy supports to address the various needs in their lives. Patients discussed what additional healthy supports could be helpful in their recovery and wellness after discharge in order to prevent future hospitalizations.   An emphasis was placed on using counselor, doctor, therapy groups, 12-step groups, and problem-specific support groups to expand supports.  They also worked as a group on developing a specific plan for several patients to deal with unhealthy supports through boundary-setting, psychoeducation with loved ones, and even termination of relationships.   Therapeutic Goals:               1)  discuss importance of adding supports to stay well once out of the hospital             2)  compare healthy versus unhealthy supports and identify some examples of each             3)  generate ideas and descriptions of healthy supports that can be added             4)  offer mutual support about how to address unhealthy supports             5)  encourage active participation in and adherence to discharge plan               Summary of Patient Progress: Worksheets were provided and patient was given the opportunity to ask questions.    Therapeutic Modalities:   Motivational Interviewing Brief Solution-Focused Therapy 

## 2020-09-08 DIAGNOSIS — F322 Major depressive disorder, single episode, severe without psychotic features: Principal | ICD-10-CM

## 2020-09-08 LAB — TSH: TSH: 0.502 u[IU]/mL (ref 0.350–4.500)

## 2020-09-08 LAB — COMPREHENSIVE METABOLIC PANEL
ALT: 13 U/L (ref 0–44)
AST: 23 U/L (ref 15–41)
Albumin: 5.2 g/dL — ABNORMAL HIGH (ref 3.5–5.0)
Alkaline Phosphatase: 53 U/L (ref 38–126)
Anion gap: 9 (ref 5–15)
BUN: 9 mg/dL (ref 6–20)
CO2: 28 mmol/L (ref 22–32)
Calcium: 9.7 mg/dL (ref 8.9–10.3)
Chloride: 100 mmol/L (ref 98–111)
Creatinine, Ser: 0.83 mg/dL (ref 0.61–1.24)
GFR, Estimated: >60 mL/min (ref >60)
Glucose, Bld: 96 mg/dL (ref 70–99)
Potassium: 3.7 mmol/L (ref 3.5–5.1)
Sodium: 137 mmol/L (ref 135–145)
Total Bilirubin: 1.6 mg/dL — ABNORMAL HIGH (ref 0.3–1.2)
Total Protein: 8.1 g/dL (ref 6.5–8.1)

## 2020-09-08 LAB — LIPID PANEL
Cholesterol: 159 mg/dL (ref 0–200)
HDL: 54 mg/dL (ref >40)
LDL Cholesterol: 95 mg/dL (ref 0–99)
Total CHOL/HDL Ratio: 2.9 RATIO
Triglycerides: 50 mg/dL (ref <150)
VLDL: 10 mg/dL (ref 0–40)

## 2020-09-08 LAB — HEMOGLOBIN A1C
Hgb A1c MFr Bld: 4.5 % — ABNORMAL LOW (ref 4.8–5.6)
Mean Plasma Glucose: 82.45 mg/dL

## 2020-09-08 MED ORDER — POTASSIUM CHLORIDE CRYS ER 20 MEQ PO TBCR
20.0000 meq | EXTENDED_RELEASE_TABLET | Freq: Two times a day (BID) | ORAL | Status: AC
  Administered 2020-09-08: 20 meq via ORAL
  Filled 2020-09-08 (×2): qty 1

## 2020-09-08 NOTE — H&P (Signed)
Psychiatric Admission Assessment Adult  Patient Identification: Richard [name]  MRN:  [id number]  Date of Evaluation:  09/08/2020  Chief Complaint: Suicide attempt by strangulation with a belt around the neck & an attempt to jump off a 3 story window.  Principal Diagnosis: MDD (major depressive disorder), severe (HCC)  Diagnosis:  Principal Problem:   MDD (major depressive disorder), severe (HCC) Active Problems:   MDD (major depressive disorder), single episode, severe , no psychosis (HCC)  History of Present Illness: This is the first psychiatric admission in this [facility] for this 20 year old AA male with no known hx of mental health or substance abuse issues. He is admitted to the [facility] from the [facility] with complain of  suicide attempt by strangulation with a belt around the neck & an attempt to jump off a 3 storey window. Patient was apparently found by his uncle with a belt around his neck while trying to jump off a 3 storey window. He was brought to the hospital for evaluation & treatment. During this assessment, Richard [name] reports,  "The EMS took me to the [facility] on Tuesday, the 21st of this month. I left the hospital & came here. My mom called the EMS on that day. She said I had thoughts of hurting myself. But, I was not thinking about or trying to hurt myself. My intention was to get my mother's attention so that we can talk. And the trick worked. What I did was, I put a belt around my neck, my mom saw me & panicked, called 911. I planned putting a belt around my neck an hour prior to doing it. I just wanted to have a conversation with my mother, that was it. I have never done this kind of stuff in the past. I have never attempted suicide in any other way or form. I have never felt like hurting myself & I will never hurt myself or anyone else. I have been stressing about work & relationship stuff. 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 Then, there is this  disagreement between me & my baby-mama. We cannot seem to agree on anything. My baby girl is 67 months old. I'm not depressed prior to all of these. I have never been evaluated for mental issues or treated for one. I'm currently not taking any medicines. There is no one in my family that has ever committed suicide. I do not need any medication for depression or anything else. I feel good".  Associated Signs/Symptoms:  Depression Symptoms:   Patient currently denies any symptoms of depression or anxiety.  Duration of Depression Symptoms: "I was never feeling depressed".  (Hypo) Manic Symptoms:   Denies any hypomanic episodes.  Anxiety Symptoms:  Excessive Worry, about being in the hospital.  Psychotic Symptoms:   Denies any hallucinations, delusions or paranoia. He does not appear to be responding to any internal stimuli.  PTSD Symptoms: Denies any PTSD symptoms or events.  Total Time spent with patient: 1 hour  Past Psychiatric History: Denies any hx of depression, anxiety or suicide attemtps.  Is the patient at risk to self? No.  Has the patient been a risk to self in the past 6 months? Yes.    Has the patient been a risk to self within the distant past? No.  Is the patient a risk to others? No.  Has the patient been a risk to others in the past 6 months? No.  Has the  patient been a risk to others within the distant past? No.   Prior Inpatient Therapy: Denies. Prior Outpatient Therapy: Denies.  Alcohol Screening: 1. How often do you have a drink containing alcohol?: Never 2. How many drinks containing alcohol do you have on a typical day when you are drinking?: 1 or 2 3. How often do you have six or more drinks on one occasion?: Never AUDIT-C Score: 0 9. Have you or someone else been injured as a result of your drinking?: No 10. Has a relative or friend or a doctor or another health worker been concerned about your drinking or suggested you cut down?: No Alcohol Use Disorder  Identification Test Final Score (AUDIT): 0  Substance Abuse History in the last 12 months:  No.  Consequences of Substance Abuse: NA  Previous Psychotropic Medications: No   Psychological Evaluations: No   Past Medical History: Denies any hx of medical conditions. However, chart review indicated hx of seizures. Past Medical History:  Diagnosis Date   Chlamydia    Scoliosis    Seizure (HCC) 03/20/2019   History reviewed. No pertinent surgical history.  Family History:  Family History  Problem Relation Age of Onset   Sickle cell anemia Mother    CVA Mother        CASUSED BY SICKLE CELL    Seizures Mother        after cva   Healthy Father    Family Psychiatric  History: Denies any familial hx of mental health issues.  Tobacco Screening: Have you used any form of tobacco in the last 30 days? (Cigarettes, Smokeless Tobacco, Cigars, and/or Pipes): No  Social History:  Social History   Substance and Sexual Activity  Alcohol Use Not Currently     Social History   Substance and Sexual Activity  Drug Use Never    Additional Social History: Patient is employed, has 1 daughter & lives in [place], [place].  Allergies:  No Known Allergies  Lab Results:  Results for orders placed or performed during the hospital encounter of 09/07/20 (from the past 48 hour(s))  Hemoglobin A1c     Status: Abnormal   Collection Time: 09/08/20  6:33 AM  Result Value Ref Range   Hgb A1c MFr Bld 4.5 (L) 4.8 - 5.6 %    Comment: (NOTE) Pre diabetes:          5.7%-6.4%  Diabetes:              >6.4%  Glycemic control for   <7.0% adults with diabetes    Mean Plasma Glucose 82.45 mg/dL    Comment: Performed at [facility], [address], [place], [place] [address]  Lipid panel     Status: None   Collection Time: 09/08/20  6:33 AM  Result Value Ref Range   Cholesterol 159 0 - 200 mg/dL   Triglycerides 50 <884 mg/dL   HDL 54 >16 mg/dL   Total CHOL/HDL Ratio 2.9 RATIO   VLDL 10 0 - 40  mg/dL   LDL Cholesterol 95 0 - 99 mg/dL    Comment:        Total Cholesterol/HDL:CHD Risk Coronary Heart Disease Risk Table                     Men   Women  1/2 Average Risk   3.4   3.3  Average Risk       5.0   4.4  2 X Average Risk  9.6   7.1  3 X Average Risk  23.4   11.0        Use the calculated Patient Ratio above and the CHD Risk Table to determine the patient's CHD Risk.        ATP III CLASSIFICATION (LDL):  <100     mg/dL   Optimal  941-740  mg/dL   Near or Above                    Optimal  130-159  mg/dL   Borderline  814-481  mg/dL   High  >856     mg/dL   Very High Performed at [facility], [address], [place], [place] [address]   TSH     Status: None   Collection Time: 09/08/20  6:33 AM  Result Value Ref Range   TSH 0.502 0.350 - 4.500 uIU/mL    Comment: Performed by a 3rd Generation assay with a functional sensitivity of <=0.01 uIU/mL. Performed at [facility], [address] [place], [place] [address]   Comprehensive metabolic panel     Status: Abnormal   Collection Time: 09/08/20  6:33 AM  Result Value Ref Range   Sodium 137 135 - 145 mmol/L   Potassium 3.7 3.5 - 5.1 mmol/L   Chloride 100 98 - 111 mmol/L   CO2 28 22 - 32 mmol/L   Glucose, Bld 96 70 - 99 mg/dL    Comment: Glucose reference range applies only to samples taken after fasting for at least 8 hours.   BUN 9 6 - 20 mg/dL   Creatinine, Ser 8.58 0.61 - 1.24 mg/dL   Calcium 9.7 8.9 - 85.0 mg/dL   Total Protein 8.1 6.5 - 8.1 g/dL   Albumin 5.2 (H) 3.5 - 5.0 g/dL   AST 23 15 - 41 U/L   ALT 13 0 - 44 U/L   Alkaline Phosphatase 53 38 - 126 U/L   Total Bilirubin 1.6 (H) 0.3 - 1.2 mg/dL   GFR, Estimated >27 >74 mL/min    Comment: (NOTE) Calculated using the CKD-EPI Creatinine Equation (2021)    Anion gap 9 5 - 15    Comment: Performed at [facility], [address], [place], [place] [address]   Blood Alcohol level:  Lab Results   Component Value Date   [facility] <10 09/06/2020   ETH <10 03/20/2019   Metabolic Disorder Labs:  Lab Results  Component Value Date   HGBA1C 4.5 (L) 09/08/2020   MPG 82.45 09/08/2020   No results found for: PROLACTIN Lab Results  Component Value Date   CHOL 159 09/08/2020   TRIG 50 09/08/2020   HDL 54 09/08/2020   CHOLHDL 2.9 09/08/2020   VLDL 10 09/08/2020   LDLCALC 95 09/08/2020   Current Medications: Current Facility-Administered Medications  Medication Dose Route Frequency Provider Last Rate Last Admin   acetaminophen (TYLENOL) tablet 650 mg  650 mg Oral Q6H PRN Novella Olive, NP       alum & mag hydroxide-simeth (MAALOX/MYLANTA) 200-200-20 MG/5ML suspension 30 mL  30 mL Oral Q4H PRN Novella Olive, NP       magnesium hydroxide (MILK OF MAGNESIA) suspension 30 mL  30 mL Oral Daily PRN Novella Olive, NP       nicotine (NICODERM CQ - dosed in mg/24 hours) patch 21 mg  21 mg Transdermal Q0600 Novella Olive, NP       potassium chloride  SA (KLOR-CON) CR tablet 20 mEq  20 mEq Oral BID Armandina Stammer I, NP       traZODone (DESYREL) tablet 50 mg  50 mg Oral QHS PRN Novella Olive, NP       PTA Medications: No medications prior to admission.   Musculoskeletal: Strength & Muscle Tone: within normal limits Gait & Station: normal Patient leans: N/A  Psychiatric Specialty Exam:  Presentation  General Appearance:  Disheveled; Other (comment) (Presents guarded, not forth-coming with information.) Eye Contact: Good Speech: Clear and Coherent; Normal Rate Speech Volume: Normal Handedness: Right  Mood and Affect  Mood: Euthymic (Denies any symptoms of depression or anxiety) Affect: Appropriate  Thought Process  Thought Processes: Coherent; Linear; Goal Directed  Duration of Psychotic Symptoms: No data recorded  Past Diagnosis of Schizophrenia or Psychoactive disorder: No  Descriptions of Associations:Intact  Orientation:Full (Time, Place and Person)  Thought  Content:Logical  Hallucinations:Hallucinations: None  Ideas of Reference:None  Suicidal Thoughts:Suicidal Thoughts: No  Homicidal Thoughts:Homicidal Thoughts: No  Sensorium  Memory: Immediate Good; Recent Good; Remote Good Judgment: Fair Insight: Lacking  Executive Functions  Concentration: Good Attention Span: Good Recall: Good Fund of Knowledge: Fair Language: Good  Psychomotor Activity  Psychomotor Activity: Psychomotor Activity: Normal  Assets  Assets: Physical Health; Social Support; Housing; Vocational/Educational; Communication Skills  Sleep  Sleep: Sleep: Good Number of Hours of Sleep: 7.5  Physical Exam: Physical Exam Vitals and nursing note reviewed.  HENT:     Head: Normocephalic.     Nose: Nose normal.     Mouth/Throat:     Pharynx: Oropharynx is clear.  Eyes:     Pupils: Pupils are equal, round, and reactive to light.  Cardiovascular:     Rate and Rhythm: Normal rate.     Pulses: Normal pulses.  Pulmonary:     Effort: Pulmonary effort is normal.  Genitourinary:    Comments: Deferred Musculoskeletal:        General: Normal range of motion.     Cervical back: Normal range of motion.  Skin:    General: Skin is warm and dry.  Neurological:     General: No focal deficit present.     Mental Status: He is alert and oriented to person, place, and time.   Review of Systems  Constitutional:  Positive for weight loss (7 Ibs in one week). Negative for chills, diaphoresis and fever.  HENT: Negative.    Eyes: Negative.   Respiratory:  Negative for cough, shortness of breath and wheezing.   Cardiovascular:  Negative for chest pain and palpitations.  Gastrointestinal:  Negative for abdominal pain, diarrhea, heartburn, nausea and vomiting.  Genitourinary: Negative.   Musculoskeletal:  Negative for joint pain and myalgias.  Skin: Negative.   Neurological:  Negative for dizziness, tingling, tremors, sensory change, speech change, focal  weakness, seizures, loss of consciousness, weakness and headaches.  Endo/Heme/Allergies:        Allergies: NKDA  Psychiatric/Behavioral:  Negative for depression (Denies any symptoms of depression), hallucinations, memory loss, substance abuse and suicidal ideas. The patient is not nervous/anxious and does not have insomnia.   Blood pressure 110/79, pulse 78, temperature 99.1 F (37.3 C), temperature source Oral, resp. rate 18, height  (1.676 m), weight 55.8 kg, SpO2 100 %. Body mass index is 19.85 kg/m.  Treatment Plan Summary: Daily contact with patient to assess and evaluate symptoms and progress in treatment and Medication management.  Treatment Plan/Recommendations:  1. Admit for crisis management and stabilization, estimated length of  stay 3-5 days.    2. Medication management to reduce current symptoms to base line and improve the patient's overall level of functioning: See MAR, Md's SRA & treatment plan.  Patient declines to be on any medications at this time.  Will continue with the current prn regimen for anxiety, sleep, pain, indigestion, constipation as already in progress.  Encouraged group participation. Discharge disposition plan is ongoing.  Observation Level/Precautions:  15 minute checks  Laboratory:   Per ED, current lab results reviewed, stable.  Psychotherapy: Group sessions   Medications: See MAR  Consultations: As needed   Discharge Concerns: Safety, mood stability   Estimated LOS: 3-5 days  Other:  Admit to the [address]-hall.   Physician Treatment Plan for Primary Diagnosis: MDD (major depressive disorder), severe (HCC) Long Term Goal(s): Improvement in symptoms so as ready for discharge  Short Term Goals: Ability to identify changes in lifestyle to reduce recurrence of condition will improve, Ability to verbalize feelings will improve, Ability to disclose and discuss suicidal ideas, and Ability to demonstrate self-control will improve  Physician Treatment  Plan for Secondary Diagnosis: Principal Problem:   MDD (major depressive disorder), severe (HCC) Active Problems:   MDD (major depressive disorder), single episode, severe , no psychosis (HCC)  Long Term Goal(s): Improvement in symptoms so as ready for discharge  Short Term Goals: Ability to identify and develop effective coping behaviors will improve, Ability to maintain clinical measurements within normal limits will improve, and Compliance with prescribed medications will improve  I certify that inpatient services furnished can reasonably be expected to improve the patient's condition.    Armandina StammerAgnes Avielle Imbert, NPpmhnp, fnp-bc 6/23/20222:08 PM

## 2020-09-08 NOTE — BHH Counselor (Signed)
CSW contacted [employer] [phone] (ext [phone] then ext 1) about Mr. Hemmelgarn FMLA/Leave of Absence for his employer.  The receptionist confirmed that Mr. Klimowicz did call and complete his portion of the intake.  She states that a fax will be sent to [employer] [facility] within the next 48 hours the Dr. Lucianne Muss to complete and fax back.  CSW will continue to look for the fax so it can be provided to Dr. Lucianne Muss for completion.

## 2020-09-08 NOTE — BHH Suicide Risk Assessment (Signed)
[facility] Admission Suicide Risk Assessment   Nursing information obtained from:  Patient Demographic factors:  Male, Adolescent or young adult Current Mental Status:  Suicidal ideation indicated by patient Loss Factors:  NA Historical Factors:  Prior suicide attempts Risk Reduction Factors:  Sense of responsibility to family  Total Time spent with patient: 1 hour Principal Problem: MDD (major depressive disorder), severe (HCC) Diagnosis:  Principal Problem:   MDD (major depressive disorder), severe (HCC) Active Problems:   MDD (major depressive disorder), single episode, severe , no psychosis (HCC)  Subjective Data: Patient is a 20 year old transferred from [facility] for stabilization and treatment of worsening of depression along with suicide attempt.  Patient this morning underplays the attempt, reports it was attention seeking but had a belt around hs neck and tried to jump out of a 3rd story window Patient also has multiple stressors which include work, relationship with the baby Cathren Harsh was his 64 th old( dropped them to [place] recently and feels he has nothing to live for as he misses his daughter. Patient is also concerned he will loose his job by being in the hospital  Continued Clinical Symptoms:  Alcohol Use Disorder Identification Test Final Score (AUDIT): 0 The "Alcohol Use Disorders Identification Test", Guidelines for Use in Primary Care, Second Edition.  World Science writer [facility]). Score between 0-7:  no or low risk or alcohol related problems. Score between 8-15:  moderate risk of alcohol related problems. Score between 16-19:  high risk of alcohol related problems. Score 20 or above:  warrants further diagnostic evaluation for alcohol dependence and treatment.   CLINICAL FACTORS:   Severe Anxiety and/or Agitation Depression:   Hopelessness Impulsivity Severe Unstable or Poor Therapeutic Relationship   Musculoskeletal: Strength & Muscle Tone: within normal  limits Gait & Station: normal Patient leans: N/A  Psychiatric Specialty Exam:  Presentation  General Appearance: Disheveled; Other (comment) (Presents guarded, not forth-coming with information.)  Eye Contact:Good  Speech:Clear and Coherent; Normal Rate  Speech Volume:Normal  Handedness:Right   Mood and Affect  Mood:Euthymic (Denies any symptoms of depression or anxiety)  Affect:Appropriate   Thought Process  Thought Processes:Coherent; Linear; Goal Directed  Descriptions of Associations:Intact  Orientation:Full (Time, Place and Person)  Thought Content:Logical  History of Schizophrenia/Schizoaffective disorder:No  Duration of Psychotic Symptoms:No data recorded Hallucinations:Hallucinations: None  Ideas of Reference:None  Suicidal Thoughts:Suicidal Thoughts: No  Homicidal Thoughts:Homicidal Thoughts: No   Sensorium  Memory:Immediate Good; Recent Good; Remote Good  Judgment:Fair  Insight:Lacking   Executive Functions  Concentration:Good  Attention Span:Good  Recall:Good  Fund of Knowledge:Fair  Language:Good   Psychomotor Activity  Psychomotor Activity:Psychomotor Activity: Normal   Assets  Assets:Physical Health; Social Support; Housing; Vocational/Educational; Communication Skills   Sleep  Sleep:Sleep: Good Number of Hours of Sleep: 7.5    Physical Exam: Physical Exam ROS Blood pressure 110/79, pulse 78, temperature 99.1 F (37.3 C), temperature source Oral, resp. rate 18, height 5\' 6"  (1.676 m), weight 55.8 kg, SpO2 100 %. Body mass index is 19.85 kg/m.   COGNITIVE FEATURES THAT CONTRIBUTE TO RISK:  Loss of executive function and Thought constriction (tunnel vision)    SUICIDE RISK:   Severe:  Frequent, intense, and enduring suicidal ideation, specific plan, no subjective intent, but some objective markers of intent (i.e., choice of lethal method), the method is accessible, some limited preparatory behavior, evidence of  impaired self-control, severe dysphoria/symptomatology, multiple risk factors present, and few if any protective factors, particularly a lack of social support.  PLAN OF CARE: Plan:  Review of chart, vital signs, medications, and notes. 1-Individual and group therapy 2-Medication management for depression:  Patient states he does not need medications as it was an impulsive decision and more attention seeking. 3-Coping skills for depression 4-Continue crisis stabilization and management 5-Address health issues--monitoring vital signs, stable 6-Treatment plan in progress to prevent relapse of depression   I certify that inpatient services furnished can reasonably be expected to improve the patient's condition.   Nelly Rout, MD 09/08/2020, 2:43 PM

## 2020-09-08 NOTE — [facility] Counselor (Signed)
Adult Comprehensive Assessment  Patient ID: Richard [name], male   DOB: 2000/11/04, 20 y.o.   MRN: [id number]  Information Source: Information source: Patient  Current Stressors:  Patient states their primary concerns and needs for treatment are:: "I've been doing some self-harming activities" Patient states their goals for this hospitilization and ongoing recovery are:: "To get some help" Educational / Learning stressors: Pt reports at 12th grade educations with [employer] Employment / Job issues: Pt reports working at [employer] in [place] Family Relationships: Pt reports improving relationships with his family Surveyor, quantity / Lack of resources (include bankruptcy): Pt reports no stressors Housing / Lack of housing: Pt reports living alone but has 2 friends staying with him Physical health (include injuries & life threatening diseases): Pt reports no stressors Social relationships: Pt reports no stressors Substance abuse: Pt denies all substance use Bereavement / Loss: Pt reports no stressors  Living/Environment/Situation:  Living Arrangements: Alone Living conditions (as described by patient or guardian): "It is good" Who else lives in the home?: No one (2 friends are visiting) How long has patient lived in current situation?: 2 months What is atmosphere in current home: Comfortable  Family History:  Marital status: Separated Separated, when?: 1 week What types of issues is patient dealing with in the relationship?: Past infidelity issues Are you sexually active?: Yes What is your sexual orientation?: Heterosexual Has your sexual activity been affected by drugs, alcohol, medication, or emotional stress?: No Does patient have children?: Yes How many children?: 1 How is patient's relationship with their children?: "I have a 19 month old daughter and we get along great"  Childhood History:  By whom was/is the patient raised?: Grandparents Additional childhood history  information: Pt reports that he moved around to various homes as a child; Father lives in [place] and Mother lives in [place] Description of patient's relationship with caregiver when they were a child: "Things weren't good with my parents" Patient's description of current relationship with people who raised him/her: "Things are improving with my mother and things are still good with my father" How were you disciplined when you got in trouble as a child/adolescent?: Groundings Does patient have siblings?: Yes Number of Siblings: 3 Description of patient's current relationship with siblings: "We get along great" Did patient suffer any verbal/emotional/physical/sexual abuse as a child?: No Did patient suffer from severe childhood neglect?: No Has patient ever been sexually abused/assaulted/raped as an adolescent or adult?: No Was the patient ever a victim of a crime or a disaster?: No Witnessed domestic violence?: No Has patient been affected by domestic violence as an adult?: No  Education:  Highest grade of school patient has completed: 12th Grade from [employer] Currently a Consulting civil engineer?: No Learning disability?: No  Employment/Work Situation:   Employment Situation: Employed Where is Patient Currently Employed?: [employer] Long has Patient Been Employed?: 2 years Are You Satisfied With Your Job?: Yes Do You Work More Than One Job?: No Work Stressors: None reported Patient's Job has Been Impacted by Current Illness: No What is the Longest Time Patient has Held a Job?: 2 years Where was the Patient Employed at that Time?: [employer] Has Patient ever Been in the [employer]?: No  Financial Resources:   Financial resources: Income from employment, Private insurance Does patient have a representative payee or guardian?: No  Alcohol/Substance Abuse:   What has been your use of drugs/alcohol within the last 12 months?: Pt denies all substance use If attempted suicide, did drugs/alcohol play a  role in this?: No Alcohol/Substance Abuse Treatment Hx: Denies past history Has alcohol/substance abuse ever caused legal problems?: No  Social Support System:   Conservation officer, nature Support System: Good Describe Merchandiser, retail System: Grandfather in [place], Grandmother in [place], and friends Type of faith/religion: Benito Mccreedy How does patient's faith help to cope with current illness?: Meditation  Leisure/Recreation:   Do You Have Hobbies?: Yes Leisure and Hobbies: Drawing and reading  Strengths/Needs:   What is the patient's perception of their strengths?: "Imagination" Patient states they can use these personal strengths during their treatment to contribute to their recovery: "I can move and spin things around with my mind" Patient states these barriers may affect/interfere with their treatment: None Patient states these barriers may affect their return to the community: None Other important information patient would like considered in planning for their treatment: None  Discharge Plan:   Currently receiving community mental health services: No Patient states concerns and preferences for aftercare planning are: Pt reports that he does not want a therapist or a psychiatrist Patient states they will know when they are safe and ready for discharge when: "When I wont hurt myself anymore" Does patient have access to transportation?: Yes (Pt reports having his own car at home) Does patient have financial barriers related to discharge medications?: No Will patient be returning to same living situation after discharge?: Yes  Summary/Recommendations:   Summary and Recommendations (to be completed by the evaluator): Richard [name] is a 20 year old, male who was admitted to the hospital due to worsening depression and an attempted suicide by placing a belt around his neck. The Pt reports that he has no previous suicide attempts and states that he did not want to hurt himself but wanted his  mother's attention.  The Pt reports childhood conflict with his mother but did not want to discuss what happened between them.  The Pt reports some issues with his wife and states that they have been seperated for approximately 1 week.  The Pt denies all substance use and reports no previous substance use treatment.  While in the hospital the Pt can benefit from crisis stabilization, medication evaluation, group therapy, psycho-education, case management, and discharge planning.  Upon discharge the Pt would like to return to his home and will follow up with a local mental health provider for therapy and medication management.  Aram Beecham. 09/08/2020

## 2020-09-08 NOTE — Progress Notes (Signed)
The patient's positive event for the day is that he was more social with his peers. In addition, he states that he is feeling better. His goal for tomorrow is to just "improve".

## 2020-09-08 NOTE — [facility] Group Notes (Signed)
[facility] Group Notes:  (Nursing)  Date:  09/08/2020  Time:  1600  Type of Therapy:  Psychoeducational Skills  Participation Level:  Active  Participation Quality:  Appropriate and Attentive  Affect:  Appropriate  Cognitive:  Alert and Appropriate  Insight:  Good  Engagement in Group:  Engaged  Modes of Intervention:  Discussion, Exploration, Rapport Building, Socialization, and Support  Summary of Progress/Problems: Group played a non-competitive Administrator, arts game that fosters listening skills as well as self expression.   Shela Nevin 09/08/2020, 5:55 PM

## 2020-09-08 NOTE — Progress Notes (Addendum)
   09/08/20 1000  Psych Admission Type (Psych Patients Only)  Admission Status Involuntary  Psychosocial Assessment  Patient Complaints Depression;Anxiety  Eye Contact Brief  Facial Expression Blank  Affect Blunted  Speech Logical/coherent;Soft  Interaction Minimal  Motor Activity Slow  Appearance/Hygiene Disheveled  Behavior Characteristics Cooperative  Mood Anxious;Sad  Thought Process  Coherency WDL  Content WDL  Delusions WDL  Perception WDL  Hallucination None reported or observed  Judgment Impaired  Confusion WDL  Danger to Self  Current suicidal ideation? Denies  Self-Injurious Behavior No self-injurious ideation or behavior indicators observed or expressed   Agreement Not to Harm Self Yes  Danger to Others  Danger to Others None reported or observed    D. Pt presented as anxious upon initial approach- observed on the unit interacting appropriately with peers and staff. Pt denies that he was suicidal, stating, "I just did it for attention", "I wanted my mother talk to me". Per pt's self inventory, pt rated his depression, hopelessness and anxiety a 0/0/1, respectively. Pt wrote that his goal today is "to know for the best and meditate on my worries", and "keep a positive mind". Pt currently denies SI/HI and AVH  A. Labs and vitals monitored.. Pt supported emotionally and encouraged to express concerns and ask questions.   R. Pt remains safe with 15 minute checks. Will continue POC.

## 2020-09-09 NOTE — Progress Notes (Addendum)
[facility]  09/09/2020 9:58 AM Richard MastRico [name]  MRN:  [id number]  Subjective:   [name] stated " I am ready to leave so I can see my mom before she goes back to [place]."   Richard [name] 20 year old African-American male was admitted due to suicide attempt.  Although patient continues to deny this was a suicide attempt.  Reports " I was trying to get my mother's attention."  Denied a history of previous inpatient admissions.  Denied that his been followed by therapy and/or psychiatry.  Stated he was just trying to talk to his mom due to verbal altercation between him and his child's mother.   Was reported that his child's mother has plans to leave patient and he became overwhelmed.  Patient appears to be remorseful and future oriented as he reports. "  I need to get back to work, and I want to see my baby."  He continues to deny suicidal or homicidal ideations.  Denies auditory or visual hallucinations.   Richard [name] continues to decline any psychotropic medication for mood stabilization.  Denies substance abuse history/use.  NP spoke to patient regarding anticipated discharge date.  Patient advised that he will likely be here through the weekend.  Patient was reluctant but accepted a plan.  Stated he is willing to follow-up with therapy at discharge.  Support, encouragement and  reassurance was provided.  Principal Problem: MDD (major depressive disorder), severe (HCC) Diagnosis: Principal Problem:   MDD (major depressive disorder), severe (HCC) Active Problems:   MDD (major depressive disorder), single episode, severe , no psychosis (HCC)  Total Time spent with patient: 15 minutes  Past Psychiatric History:   Past Medical History:  Past Medical History:  Diagnosis Date   Chlamydia    Scoliosis    Seizure (HCC) 03/20/2019   History reviewed. No pertinent surgical history. Family History:  Family History  Problem Relation Age of Onset   Sickle cell anemia Mother    CVA Mother        CASUSED  BY SICKLE CELL    Seizures Mother        after cva   Healthy Father    Family Psychiatric  History:  Social History:  Social History   Substance and Sexual Activity  Alcohol Use Not Currently     Social History   Substance and Sexual Activity  Drug Use Never    Social History   Socioeconomic History   Marital status: Significant Other    Spouse name: Not on file   Number of children: Not on file   Years of education: Not on file   Highest education level: Not on file  Occupational History   Not on file  Tobacco Use   Smoking status: Every Day    Packs/day: 0.20    Pack years: 0.00    Types: Cigarettes   Smokeless tobacco: Never   Tobacco comments:    2 cigs per day  Vaping Use   Vaping Use: Never used  Substance and Sexual Activity   Alcohol use: Not Currently   Drug use: Never   Sexual activity: Yes    Birth control/protection: None  Other Topics Concern   Not on file  Social History Narrative   Not on file   Social Determinants of Health   Financial Resource Strain: Not on file  Food Insecurity: Not on file  Transportation Needs: Not on file  Physical Activity: Not on file  Stress: Not on file  Social Connections: Not  on file   Additional Social History:                         Sleep: Good  Appetite:  Fair  Current Medications: Current Facility-Administered Medications  Medication Dose Route Frequency Provider Last Rate Last Admin   acetaminophen (TYLENOL) tablet 650 mg  650 mg Oral Q6H PRN Novella Olive, NP       alum & mag hydroxide-simeth (MAALOX/MYLANTA) 200-200-20 MG/5ML suspension 30 mL  30 mL Oral Q4H PRN Novella Olive, NP       magnesium hydroxide (MILK OF MAGNESIA) suspension 30 mL  30 mL Oral Daily PRN Novella Olive, NP       nicotine (NICODERM CQ - dosed in mg/24 hours) patch 21 mg  21 mg Transdermal Q0600 Novella Olive, NP       potassium chloride SA (KLOR-CON) CR tablet 20 mEq  20 mEq Oral BID Armandina Stammer I, NP   20  mEq at 09/08/20 1759   traZODone (DESYREL) tablet 50 mg  50 mg Oral QHS PRN Novella Olive, NP        Lab Results:  Results for orders placed or performed during the hospital encounter of 09/07/20 (from the past 48 hour(s))  Hemoglobin A1c     Status: Abnormal   Collection Time: 09/08/20  6:33 AM  Result Value Ref Range   Hgb A1c MFr Bld 4.5 (L) 4.8 - 5.6 %    Comment: (NOTE) Pre diabetes:          5.7%-6.4%  Diabetes:              >6.4%  Glycemic control for   <7.0% adults with diabetes    Mean Plasma Glucose 82.45 mg/dL    Comment: Performed at [facility], [address] [place], [place] [address]  Lipid panel     Status: None   Collection Time: 09/08/20  6:33 AM  Result Value Ref Range   Cholesterol 159 0 - 200 mg/dL   Triglycerides 50 <299 mg/dL   HDL 54 >37 mg/dL   Total CHOL/HDL Ratio 2.9 RATIO   VLDL 10 0 - 40 mg/dL   LDL Cholesterol 95 0 - 99 mg/dL    Comment:        Total Cholesterol/HDL:CHD Risk Coronary Heart Disease Risk Table                     Men   Women  1/2 Average Risk   3.4   3.3  Average Risk       5.0   4.4  2 X Average Risk   9.6   7.1  3 X Average Risk  23.4   11.0        Use the calculated Patient Ratio above and the CHD Risk Table to determine the patient's CHD Risk.        ATP III CLASSIFICATION (LDL):  <100     mg/dL   Optimal  169-678  mg/dL   Near or Above                    Optimal  130-159  mg/dL   Borderline  938-101  mg/dL   High  >751     mg/dL   Very High Performed at [facility], [address] [place], [place] [address]   TSH     Status: None   Collection  Time: 09/08/20  6:33 AM  Result Value Ref Range   TSH 0.502 0.350 - 4.500 uIU/mL    Comment: Performed by a 3rd Generation assay with a functional sensitivity of <=0.01 uIU/mL. Performed at [facility], [address] [place], [place] [address]   Comprehensive metabolic panel     Status: Abnormal   Collection Time:  09/08/20  6:33 AM  Result Value Ref Range   Sodium 137 135 - 145 mmol/L   Potassium 3.7 3.5 - 5.1 mmol/L   Chloride 100 98 - 111 mmol/L   CO2 28 22 - 32 mmol/L   Glucose, Bld 96 70 - 99 mg/dL    Comment: Glucose reference range applies only to samples taken after fasting for at least 8 hours.   BUN 9 6 - 20 mg/dL   Creatinine, Ser 0.10 0.61 - 1.24 mg/dL   Calcium 9.7 8.9 - 27.2 mg/dL   Total Protein 8.1 6.5 - 8.1 g/dL   Albumin 5.2 (H) 3.5 - 5.0 g/dL   AST 23 15 - 41 U/L   ALT 13 0 - 44 U/L   Alkaline Phosphatase 53 38 - 126 U/L   Total Bilirubin 1.6 (H) 0.3 - 1.2 mg/dL   GFR, Estimated >53 >66 mL/min    Comment: (NOTE) Calculated using the CKD-EPI Creatinine Equation (2021)    Anion gap 9 5 - 15    Comment: Performed at [facility], [address] [place], [place] [address]    Blood Alcohol level:  Lab Results  Component Value Date   [facility] <10 09/06/2020   ETH <10 03/20/2019    Metabolic Disorder Labs: Lab Results  Component Value Date   HGBA1C 4.5 (L) 09/08/2020   MPG 82.45 09/08/2020   No results found for: PROLACTIN Lab Results  Component Value Date   CHOL 159 09/08/2020   TRIG 50 09/08/2020   HDL 54 09/08/2020   CHOLHDL 2.9 09/08/2020   VLDL 10 09/08/2020   LDLCALC 95 09/08/2020    Physical Findings: AIMS: Facial and Oral Movements Muscles of Facial Expression: None, normal Lips and Perioral Area: None, normal Jaw: None, normal Tongue: None, normal,Extremity Movements Upper (arms, wrists, hands, fingers): None, normal Lower (legs, knees, ankles, toes): None, normal, Trunk Movements Neck, shoulders, hips: None, normal, Overall Severity Severity of abnormal movements (highest score from questions above): None, normal Incapacitation due to abnormal movements: None, normal Patient's awareness of abnormal movements (rate only patient's report): No Awareness, Dental Status Current problems with teeth and/or dentures?: No Does patient  usually wear dentures?: No  CIWA:    COWS:     Musculoskeletal: Strength & Muscle Tone: within normal limits Gait & Station: normal Patient leans: N/A  Psychiatric Specialty Exam:  Presentation  General Appearance: Disheveled; Other (comment) (Presents guarded, not forth-coming with information.)  Eye Contact:Good  Speech:Clear and Coherent; Normal Rate  Speech Volume:Normal  Handedness:Right   Mood and Affect  Mood:Euthymic (Denies any symptoms of depression or anxiety)  Affect:Appropriate   Thought Process  Thought Processes:Coherent; Linear; Goal Directed  Descriptions of Associations:Intact  Orientation:Full (Time, Place and Person)  Thought Content:Logical  History of Schizophrenia/Schizoaffective disorder:No  Duration of Psychotic Symptoms:No data recorded Hallucinations:Hallucinations: None  Ideas of Reference:None  Suicidal Thoughts:Suicidal Thoughts: No  Homicidal Thoughts:Homicidal Thoughts: No   Sensorium  Memory:Immediate Good; Recent Good; Remote Good  Judgment:Fair  Insight:Lacking   Executive Functions  Concentration:Good  Attention Span:Good  Recall:Good  Fund of Knowledge:Fair  Language:Good   Psychomotor  Activity  Psychomotor Activity:Psychomotor Activity: Normal   Assets  Assets:Physical Health; Social Support; Housing; Vocational/Educational; Communication Skills   Sleep  Sleep:Sleep: Good Number of Hours of Sleep: 7.5    Physical Exam: Physical Exam Vitals reviewed.  Cardiovascular:     Rate and Rhythm: Normal rate.     Pulses: Normal pulses.  Neurological:     Mental Status: He is alert.  Psychiatric:        Attention and Perception: Attention normal.        Mood and Affect: Mood normal.        Speech: Speech normal.        Behavior: Behavior normal. Behavior is cooperative.        Thought Content: Thought content normal.        Cognition and Memory: Cognition normal.        Judgment: Judgment  normal.   ROS Blood pressure 104/72, pulse 93, temperature 98.1 F (36.7 C), temperature source Oral, resp. rate 16, height 5\' 6"  (1.676 m), weight 55.8 kg, SpO2 100 %. Body mass index is 19.85 kg/m.   Treatment Plan Summary: Daily contact with patient to assess and evaluate symptoms and progress in treatment  Continue with current treatment plan on 09/09/2020 as listed below except were noted  Continue trazodone 50 mg p.o. as needed nightly for insomnia  CSW to continue working on discharge disposition Patient encouraged to participate within the therapeutic milieu   09/11/2020, NP 09/09/2020, 9:58 AM

## 2020-09-09 NOTE — B[facility] Counselor (Signed)
Type of Therapy and Topic:  Group Therapy:  Positive Affirmations   Participation Level:  Active  Description of Group: This group addressed positive affirmation toward self and others. Patients went around the room and identified two positive things about themselves and two positive things about a peer in the room. Patients reflected on how it felt to share something positive with others, to identify positive things about themselves, and to hear positive things from others. Patients were encouraged to have a daily reflection of positive characteristics or circumstances. Therapeutic Goals Patient will verbalize two of their positive qualities Patient will demonstrate empathy for others by stating two positive qualities about a peer in the group Patient will verbalize their feelings when voicing positive self affirmations and when voicing positive affirmations of others Patients will discuss the potential positive impact on their wellness/recovery of focusing on positive traits of self and others. Summary of Patient Progress: [name] attended the group and participated in the discussion with their peers. [name] states that one of his positive qualities is that he reads and his positive affirmation is that he tells himself that he will not limit himself.     Therapeutic Modalities Cognitive Behavioral Therapy Motivational Interviewing

## 2020-09-09 NOTE — Progress Notes (Addendum)
   09/09/20 1400  Psych Admission Type (Psych Patients Only)  Admission Status Involuntary  Psychosocial Assessment  Patient Complaints None  Eye Contact Brief  Facial Expression Blank  Affect Blunted  Speech Logical/coherent;Soft  Interaction Minimal  Motor Activity Slow  Appearance/Hygiene Unremarkable  Behavior Characteristics Cooperative  Mood Anxious  Thought Process  Coherency WDL  Content WDL  Delusions WDL  Perception WDL  Hallucination None reported or observed  Judgment Impaired  Confusion WDL  Danger to Self  Current suicidal ideation? Denies  Self-Injurious Behavior No self-injurious ideation or behavior indicators observed or expressed   Agreement Not to Harm Self Yes  Danger to Others  Danger to Others None reported or observed   D. Pt presents with a flat affect/ anxious mood- calm, cooperative, somewhat guarded behavior. Pt is appropriate during interactions- observed in the dayroom attending groups.Per pt's self inventory, pt rated his depression, hopelessness and anxiety a 1/0/0, respectively. Pt wrote that his goal today is "to become more uprising on leaving here. Also meditation." Pt currently denies SI/HI and AVH  A. Labs and vitals monitored. Pt supported emotionally and encouraged to express concerns and ask questions.   R. Pt remains safe with 15 minute checks. Will continue POC.

## 2020-09-09 NOTE — Tx Team (Signed)
Interdisciplinary Treatment and Diagnostic Plan Update  09/09/2020 Time of Session:  Richard [name] MRN: [id number]  Principal Diagnosis: MDD (major depressive disorder), severe (Richard [name])  Secondary Diagnoses: Principal Problem:   MDD (major depressive disorder), severe (Richard [name]) Active Problems:   MDD (major depressive disorder), single episode, severe , no psychosis (Richard [name])   Current Medications:  Current Facility-Administered Medications  Medication Dose Route Frequency Provider Last Rate Last Admin   acetaminophen (TYLENOL) tablet 650 mg  650 mg Oral Q6H PRN Chalmers Guest, NP       alum & mag hydroxide-simeth (MAALOX/MYLANTA) 200-200-20 MG/5ML suspension 30 mL  30 mL Oral Q4H PRN Chalmers Guest, NP       magnesium hydroxide (MILK OF MAGNESIA) suspension 30 mL  30 mL Oral Daily PRN Chalmers Guest, NP       nicotine (NICODERM CQ - dosed in mg/24 hours) patch 21 mg  21 mg Transdermal Q0600 Chalmers Guest, NP       potassium chloride SA (KLOR-CON) CR tablet 20 mEq  20 mEq Oral BID Lindell Spar I, NP   20 mEq at 09/08/20 1759   traZODone (DESYREL) tablet 50 mg  50 mg Oral QHS PRN Chalmers Guest, NP       PTA Medications: No medications prior to admission.    Patient Stressors: Marital or family conflict Occupational concerns  Patient Strengths: General fund of knowledge Physical Health  Treatment Modalities: Medication Management, Group therapy, Case management,  1 to 1 session with clinician, Psychoeducation, Recreational therapy.   Physician Treatment Plan for Primary Diagnosis: MDD (major depressive disorder), severe (Richard [name]) Long Term Goal(s): Improvement in symptoms so as ready for discharge   Short Term Goals: Ability to identify and develop effective coping behaviors will improve Ability to maintain clinical measurements within normal limits will improve Compliance with prescribed medications will improve Ability to identify changes in lifestyle to reduce recurrence of condition will  improve Ability to verbalize feelings will improve Ability to disclose and discuss suicidal ideas Ability to demonstrate self-control will improve  Medication Management: Evaluate patient's response, side effects, and tolerance of medication regimen.  Therapeutic Interventions: 1 to 1 sessions, Unit Group sessions and Medication administration.  Evaluation of Outcomes: Not Met  Physician Treatment Plan for Secondary Diagnosis: Principal Problem:   MDD (major depressive disorder), severe (Richard [name]) Active Problems:   MDD (major depressive disorder), single episode, severe , no psychosis (Richard [name])  Long Term Goal(s): Improvement in symptoms so as ready for discharge   Short Term Goals: Ability to identify and develop effective coping behaviors will improve Ability to maintain clinical measurements within normal limits will improve Compliance with prescribed medications will improve Ability to identify changes in lifestyle to reduce recurrence of condition will improve Ability to verbalize feelings will improve Ability to disclose and discuss suicidal ideas Ability to demonstrate self-control will improve     Medication Management: Evaluate patient's response, side effects, and tolerance of medication regimen.  Therapeutic Interventions: 1 to 1 sessions, Unit Group sessions and Medication administration.  Evaluation of Outcomes: Not Met   RN Treatment Plan for Primary Diagnosis: MDD (major depressive disorder), severe (Richard [name]) Long Term Goal(s): Knowledge of disease and therapeutic regimen to maintain health will improve  Short Term Goals: Ability to demonstrate self-control, Ability to participate in decision making will improve, and Ability to verbalize feelings will improve  Medication Management: RN will administer medications as ordered by provider, will assess and evaluate patient's response and provide education to patient for  prescribed medication. RN will report any adverse and/or  side effects to prescribing provider.  Therapeutic Interventions: 1 on 1 counseling sessions, Psychoeducation, Medication administration, Evaluate responses to treatment, Monitor vital signs and CBGs as ordered, Perform/monitor CIWA, COWS, AIMS and Fall Risk screenings as ordered, Perform wound care treatments as ordered.  Evaluation of Outcomes: Not Met   LCSW Treatment Plan for Primary Diagnosis: MDD (major depressive disorder), severe (Richard [name]) Long Term Goal(s): Safe transition to appropriate next level of care at discharge, Engage patient in therapeutic group addressing interpersonal concerns.  Short Term Goals: Engage patient in aftercare planning with referrals and resources, Increase ability to appropriately verbalize feelings, and Increase emotional regulation  Therapeutic Interventions: Assess for all discharge needs, 1 to 1 time with Social worker, Explore available resources and support systems, Assess for adequacy in community support network, Educate family and significant other(s) on suicide prevention, Complete Psychosocial Assessment, Interpersonal group therapy.  Evaluation of Outcomes: Not Met   Progress in Treatment: Attending groups: Yes. Participating in groups: Yes. Taking medication as prescribed: No. Toleration medication: No. Family/Significant other contact made: No, will contact:  mother Patient understands diagnosis: No. Discussing patient identified problems/goals with staff: Yes. Medical problems stabilized or resolved: Yes. Denies suicidal/homicidal ideation: Yes. Issues/concerns per patient self-inventory: No. Other: None  New problem(s) identified: No, Describe:  None  New Short Term/Long Term Goal(s):medication stabilization, elimination of SI thoughts, development of comprehensive mental wellness plan.   Patient Goals:  "to go home"  Discharge Plan or Barriers: Patient recently admitted. CSW will continue to follow and assess for appropriate  referrals and possible discharge planning.   Reason for Continuation of Hospitalization: Depression Medication stabilization Suicidal ideation  Estimated Length of Stay: 3-5 days  Attendees: Patient: Richard [name] 09/09/2020 10:15 AM  Physician:  09/09/2020 10:15 AM  Nursing:  09/09/2020 10:15 AM  RN Care Manager: 09/09/2020 10:15 AM  Social Worker: Toney Reil, [place] 09/09/2020 10:15 AM  Recreational Therapist:  09/09/2020 10:15 AM  Other:  09/09/2020 10:15 AM  Other:  09/09/2020 10:15 AM  Other: 09/09/2020 10:15 AM    Scribe for Treatment Team: Mliss Fritz, [place] 09/09/2020 10:15 AM

## 2020-09-09 NOTE — Progress Notes (Signed)
Adult Psychoeducational Group Note  Date:  09/09/2020 Time:  8:29 PM  Group Topic/Focus:  Wrap-Up Group:   The focus of this group is to help patients review their daily goal of treatment and discuss progress on daily workbooks.  Participation Level:  Did Not Attend  Participation Quality: did not attend Richard [name] 09/09/2020, 8:29 PM

## 2020-09-09 NOTE — Progress Notes (Signed)
   09/09/20 2000  Psych Admission Type (Psych Patients Only)  Admission Status Involuntary  Psychosocial Assessment  Patient Complaints None  Eye Contact Brief  Facial Expression Blank  Affect Blunted  Speech Logical/coherent;Soft  Interaction Minimal  Motor Activity Slow  Appearance/Hygiene Unremarkable  Behavior Characteristics Cooperative  Mood Pleasant  Thought Process  Coherency WDL  Content WDL  Delusions WDL  Perception WDL  Hallucination None reported or observed  Judgment Impaired  Confusion WDL  Danger to Self  Current suicidal ideation? Denies  Self-Injurious Behavior No self-injurious ideation or behavior indicators observed or expressed   Agreement Not to Harm Self Yes  Danger to Others  Danger to Others None reported or observed

## 2020-09-09 NOTE — Progress Notes (Signed)
Recreation Therapy Notes  Date:  6.24.22 Time: 0930 Location: [address] Dayroom   Group Topic: Stress Management   Goal Area(s) Addresses: Patient will identify positive stress management techniques. Patient will identify benefits of using stress management post d/c.   Intervention: Stress Management    Activity: Meditation.  LRT play a meditation for patients to calm and relax the body.     Education:  Stress Management, Discharge Planning.   Education Outcome: Acknowledges Education   Clinical Observations/Feedback:  LRT unable to do group due to fire drill and goals group running over time.     Nakeita Styles, LRT/CTRS         Darolyn Double A 09/09/2020 10:49 AM 

## 2020-09-09 NOTE — Progress Notes (Signed)
Adult Psychoeducational Group Note  Date:  09/09/2020 Time:  10:24 AM  Group Topic/Focus:  Goals Group:   The focus of this group is to help patients establish daily goals to achieve during treatment and discuss how the patient can incorporate goal setting into their daily lives to aide in recovery.  Participation Level:  Active  Participation Quality:  Appropriate  Affect:  Appropriate  Cognitive:  Appropriate  Insight: Appropriate  Engagement in Group:  Engaged  Modes of Intervention:  Discussion  Additional Comments:  Pt stated his goal for the day is to clear his mind.  Wynema Birch D 09/09/2020, 10:24 AM

## 2020-09-10 NOTE — Progress Notes (Signed)
[facility] MD Progress Note  09/10/2020 1:03 PM Richard [name]  MRN:  [id number]  Subjective:   Richard [name] stated " I am ready to leave so I can continue to work on getting my family back together."   [name] 20 year old African-American male was admitted due to suicide attempt.  Patient continues to deny this was a suicide attempt.  He stated  " I was trying to get my mother's attention, I wanted to talk to her about my problem but did not know how to do so."  Denied a history of previous inpatient admissions.  Denied that his been followed by therapy and/or psychiatry.  Stated he was just trying to talk to his mom due to verbal altercation between him and his child's mother.  Patient reported that his child's mother went to [place] and he wants to get her and their daughter back home. He stated he became overwhelmed with the prospect of her leaving.   Patient appears to be remorseful and future oriented as he reports. "  I need to get back to work, and I want to see my baby."  He continues to deny suicidal or homicidal ideations.  Denies auditory or visual hallucinations. He does not appear to be responding to internal stimuli.  [name] continues to decline any psychotropic medication for mood stabilization.  Denies substance abuse history/use.  NP spoke to patient regarding anticipated discharge date.  Patient advised that he will likely be here through the weekend.  Patient was reluctant but accepted a plan.  Stated he is willing to follow-up with therapy at discharge.  Support, encouragement and  reassurance was provided.  Principal Problem: MDD (major depressive disorder), severe ([facility]) Diagnosis: Principal Problem:   MDD (major depressive disorder), severe ([facility]) Active Problems:   MDD (major depressive disorder), single episode, severe , no psychosis ([facility])  Total Time spent with patient: 15 minutes  Past Psychiatric History:   Past Medical History:  Past Medical History:  Diagnosis Date   Chlamydia     Scoliosis    Seizure ([facility]) 03/20/2019   History reviewed. No pertinent surgical history. Family History:  Family History  Problem Relation Age of Onset   Sickle cell anemia Mother    CVA Mother        CASUSED BY SICKLE CELL    Seizures Mother        after cva   Healthy Father    Family Psychiatric  History:  Social History:  Social History   Substance and Sexual Activity  Alcohol Use Not Currently     Social History   Substance and Sexual Activity  Drug Use Never    Social History   Socioeconomic History   Marital status: Significant Other    Spouse name: Not on file   Number of children: Not on file   Years of education: Not on file   Highest education level: Not on file  Occupational History   Not on file  Tobacco Use   Smoking status: Every Day    Packs/day: 0.20    Pack years: 0.00    Types: Cigarettes   Smokeless tobacco: Never   Tobacco comments:    2 cigs per day  Vaping Use   Vaping Use: Never used  Substance and Sexual Activity   Alcohol use: Not Currently   Drug use: Never   Sexual activity: Yes    Birth control/protection: None  Other Topics Concern   Not on file  Social History Narrative   Not  on file   Social Determinants of Health   Financial Resource Strain: Not on file  Food Insecurity: Not on file  Transportation Needs: Not on file  Physical Activity: Not on file  Stress: Not on file  Social Connections: Not on file   Additional Social History:     Sleep: Good  Appetite:  Fair  Current Medications: Current Facility-Administered Medications  Medication Dose Route Frequency Provider Last Rate Last Admin   acetaminophen (TYLENOL) tablet 650 mg  650 mg Oral Q6H PRN Novella Olive, NP       alum & mag hydroxide-simeth (MAALOX/MYLANTA) 200-200-20 MG/5ML suspension 30 mL  30 mL Oral Q4H PRN Novella Olive, NP       magnesium hydroxide (MILK OF MAGNESIA) suspension 30 mL  30 mL Oral Daily PRN Novella Olive, NP       nicotine  (NICODERM CQ - dosed in mg/24 hours) patch 21 mg  21 mg Transdermal Q0600 Novella Olive, NP       traZODone (DESYREL) tablet 50 mg  50 mg Oral QHS PRN Novella Olive, NP        Lab Results:  No results found for this or any previous visit (from the past 48 hour(s)).   Blood Alcohol level:  Lab Results  Component Value Date   ETH <10 09/06/2020   ETH <10 03/20/2019    Metabolic Disorder Labs: Lab Results  Component Value Date   HGBA1C 4.5 (L) 09/08/2020   MPG 82.45 09/08/2020   No results found for: PROLACTIN Lab Results  Component Value Date   CHOL 159 09/08/2020   TRIG 50 09/08/2020   HDL 54 09/08/2020   CHOLHDL 2.9 09/08/2020   VLDL 10 09/08/2020   LDLCALC 95 09/08/2020    Physical Findings: AIMS: Facial and Oral Movements Muscles of Facial Expression: None, normal Lips and Perioral Area: None, normal Jaw: None, normal Tongue: None, normal,Extremity Movements Upper (arms, wrists, hands, fingers): None, normal Lower (legs, knees, ankles, toes): None, normal, Trunk Movements Neck, shoulders, hips: None, normal, Overall Severity Severity of abnormal movements (highest score from questions above): None, normal Incapacitation due to abnormal movements: None, normal Patient's awareness of abnormal movements (rate only patient's report): No Awareness, Dental Status Current problems with teeth and/or dentures?: No Does patient usually wear dentures?: No  CIWA:    COWS:     Musculoskeletal: Strength & Muscle Tone: within normal limits Gait & Station: normal Patient leans: N/A  Psychiatric Specialty Exam:  Presentation  General Appearance: Disheveled; Other (comment) (Presents guarded, not forth-coming with information.)  Eye Contact:Good  Speech:Clear and Coherent; Normal Rate  Speech Volume:Normal  Handedness:Right   Mood and Affect  Mood:Euthymic (Denies any symptoms of depression or anxiety)  Affect:Appropriate   Thought Process  Thought  Processes:Coherent; Linear; Goal Directed  Descriptions of Associations:Intact  Orientation:Full (Time, Place and Person)  Thought Content:Logical  History of Schizophrenia/Schizoaffective disorder:No  Duration of Psychotic Symptoms:No data recorded Hallucinations:No data recorded  Ideas of Reference:None  Suicidal Thoughts:No data recorded  Homicidal Thoughts:No data recorded   Sensorium  Memory:Immediate Good; Recent Good; Remote Good  Judgment:Fair  Insight:Lacking   Executive Functions  Concentration:Good  Attention Span:Good  Recall:Good  Fund of Knowledge:Fair  Language:Good   Psychomotor Activity  Psychomotor Activity:No data recorded   Assets  Assets:Physical Health; Social Support; Housing; Vocational/Educational; Communication Skills   Sleep  Sleep:No data recorded    Physical Exam: Physical Exam Vitals reviewed.  Cardiovascular:     Rate  and Rhythm: Normal rate.     Pulses: Normal pulses.  Neurological:     Mental Status: He is alert.  Psychiatric:        Attention and Perception: Attention normal.        Mood and Affect: Mood normal.        Speech: Speech normal.        Behavior: Behavior normal. Behavior is cooperative.        Thought Content: Thought content normal.        Cognition and Memory: Cognition normal.        Judgment: Judgment normal.   ROS Blood pressure 111/83, pulse 64, temperature 100.1 F (37.8 C), temperature source Oral, resp. rate 16, height 5\' 6"  (1.676 m), weight 55.8 kg, SpO2 100 %. Body mass index is 19.85 kg/m.   Treatment Plan Summary: Daily contact with patient to assess and evaluate symptoms and progress in treatment  Continue with current treatment plan on 09/10/2020 as listed below except were noted  Continue trazodone 50 mg p.o. as needed nightly for insomnia  CSW to continue working on discharge disposition Patient encouraged to participate within the therapeutic milieu   09/12/2020, NP 09/10/2020, 4:59 PM

## 2020-09-10 NOTE — Progress Notes (Signed)
Patient states that he had a good day overall but that he was feeling very tired. His goal for tomorrow is to learn something new.

## 2020-09-10 NOTE — Progress Notes (Signed)
Pt A & O X4, denies SI, HI, AVH and pain when assessed. Visible in milieu, observed pacing hall with a steady gait at intervals. Pt noted to be anxious but animated with fair eye contact and logical speech. Rates his depression 1/10, hopelessness and anxiety both 0/10. Reports he slept well last night with fair appetite "The food is not that good right now high energy and good concentration level. Pt awake in dayroom with peers at this time, pt attended scheduled groups when prompted, engaged in activities on and off unit. Verbalized concerns about d/c "I just want to go home, I'm not comfortable here, this is not just home and I miss the food at home. My room mate is not showering, I can't stand it in there. I put the AC up, he turns it down. I can't do it". Emotional support offered to pt. Q 15 minutes safety checks maintained. Writer encouraged pt to voice concerns. D/C procedure discussed with pt. Pt remains cooperative with care. Verbalized understanding related to d/c procedure. Pt moved to another room. Denies concerns at this time.

## 2020-09-10 NOTE — [facility] Group Notes (Signed)
LCSW Group Therapy Note  09/10/2020   10:00-11:00am   Type of Therapy and Topic:  Group Therapy: Anger Cues and Responses  Participation Level:  Active   Description of Group:   In this group, patients learned how to recognize the physical, cognitive, emotional, and behavioral responses they have to anger-provoking situations.  They identified a recent time they became angry and how they reacted.  They analyzed how their reaction was possibly beneficial and how it was possibly unhelpful.  The group discussed a variety of healthier coping skills that could help with such a situation in the future.  Focus was placed on how helpful it is to recognize the underlying emotions to our anger, because working on those can lead to a more permanent solution as well as our ability to focus on the important rather than the urgent.  Therapeutic Goals: Patients will remember their last incident of anger and how they felt emotionally and physically, what their thoughts were at the time, and how they behaved. Patients will identify how their behavior at that time worked for them, as well as how it worked against them. Patients will explore possible new behaviors to use in future anger situations. Patients will learn that anger itself is normal and cannot be eliminated, and that healthier reactions can assist with resolving conflict rather than worsening situations.  Summary of Patient Progress:  The patient shared that he was angry when he realized a trap he had set did not catch the animal he was seeking to trap.  Therapeutic Modalities:   Cognitive Behavioral Therapy  Evorn Gong

## 2020-09-10 NOTE — [facility] Group Notes (Signed)
Adult Psychoeducational Group Note  Date:  09/10/2020 Time:  10:22 AM  Group Topic/Focus:  Goals Group:   The focus of this group is to help patients establish daily goals to achieve during treatment and discuss how the patient can incorporate goal setting into their daily lives to aide in recovery.  Participation Level:  Active  Participation Quality:  Appropriate  Affect:  Appropriate  Cognitive:  Appropriate  Insight: Appropriate  Engagement in Group:  Engaged  Modes of Intervention:  Discussion  Additional Comments:  Patient attended coping skills group and participated.   Magdelena Kinsella W Kalinda Romaniello 09/10/2020, 10:22 AM

## 2020-09-10 NOTE — Progress Notes (Signed)
Patient attend wrap up group AA. 

## 2020-09-11 NOTE — [facility] Group Notes (Signed)
[facility] Group Notes:  (Nursing/MHT/Case Management/Adjunct)  Date:  09/11/2020  Time:  5:23 PM  Type of Therapy:  Relaxation therapy  Participation Level:  Did Not Attend  Participation Quality:    Affect:    Cognitive:    Insight:    Engagement in Group:    Modes of Intervention:  Activity  Summary of Progress/Problems:  Did not attend despite staff invitation.    Chigozie Basaldua V Kamani Lewter 09/11/2020, 5:23 PM 

## 2020-09-11 NOTE — Plan of Care (Signed)
?  Problem: Education: ?Goal: Mental status will improve ?Outcome: Progressing ?Goal: Verbalization of understanding the information provided will improve ?Outcome: Progressing ?  ?

## 2020-09-11 NOTE — Progress Notes (Signed)
Adult Psychoeducational Group Note  Date:  09/11/2020 Time:  10:48 AM  Group Topic/Focus:  Goals Group:   The focus of this group is to help patients establish daily goals to achieve during treatment and discuss how the patient can incorporate goal setting into their daily lives to aide in recovery.  Participation Level:  Active  Participation Quality:  Appropriate  Affect:  Appropriate  Cognitive:  Alert  Insight: Appropriate  Engagement in Group:  Engaged  Modes of Intervention:  Discussion  Additional Comments:  Pt attended group and participated in discussion.  Richard [name] R Azarie Coriz 09/11/2020, 10:48 AM

## 2020-09-11 NOTE — Progress Notes (Signed)
D- Patient alert and oriented. Patient Affect/mood reported as depression 0 and anxiety 0. Denies SI, HI, AVH, and pain. Patient Goal:  " medication, patience, and dedication.   A- Scheduled medications administered to patient, per MD orders. Support and encouragement provided.  Routine safety checks conducted every 15 minutes.  Patient informed to notify staff with problems or concerns.  R- No adverse drug reactions noted. Patient contracts for safety at this time. Patient compliant with medications and treatment plan. Patient receptive, calm, and cooperative. Patient interacts well with others on the unit.  Patient remains safe at this time.            [place] NOVEL CORONAVIRUS (COVID-19) DAILY CHECK-OFF SYMPTOMS - answer yes or no to each - every day NO YES  Have you had a fever in the past 24 hours?  Fever (Temp > 37.80C / 100F) X    Have you had any of these symptoms in the past 24 hours? New Cough  Sore Throat   Shortness of Breath  Difficulty Breathing  Unexplained Body Aches   X    Have you had any one of these symptoms in the past 24 hours not related to allergies?   Runny Nose  Nasal Congestion  Sneezing   X    If you have had runny nose, nasal congestion, sneezing in the past 24 hours, has it worsened?   X    EXPOSURES - check yes or no X    Have you traveled outside the state in the past 14 days?   X    Have you been in contact with someone with a confirmed diagnosis of COVID-19 or PUI in the past 14 days without wearing appropriate PPE?   X    Have you been living in the same home as a person with confirmed diagnosis of COVID-19 or a PUI (household contact)?     X    Have you been diagnosed with COVID-19?     X                                                                                                                             What to do next: Answered NO to all: Answered YES to anything:    Proceed with unit schedule Follow the [facility] Inpatient  Flowsheet.

## 2020-09-11 NOTE — [facility] Group Notes (Signed)
[facility] Group Notes:  (Nursing/MHT/Case Management/Adjunct)   Date:  09/11/2020  Time:  4:06 PM   Type of Therapy:  Nurse Education   Participation Level:  Did Not Attend   Participation Quality:               Affect:     Cognitive:     Insight:     Engagement in Group:     Modes of Intervention:     Summary of Progress/Problems:  Did not attend group despite staff invitation.   Courtland Coppa V Tyreonna Czaplicki 

## 2020-09-11 NOTE — [facility] Suicide Risk Assessment (Signed)
[facility] INPATIENT:  Family/Significant Other Suicide Prevention Education  Suicide Prevention Education:  Education Completed; Gloriajean Dell [phone] (Mother),  (name of family member/significant other) has been identified by the patient as the family member/significant other with whom the patient will be residing, and identified as the person(s) who will aid the patient in the event of a mental health crisis (suicidal ideations/suicide attempt).  With written consent from the patient, the family member/significant other has been provided the following suicide prevention education, prior to the and/or following the discharge of the patient.  The suicide prevention education provided includes the following: Suicide risk factors Suicide prevention and interventions National Suicide Hotline telephone number [facility] assessment telephone number [facility] Emergency Assistance 911 [facility] and/or Residential Mobile Crisis Unit telephone number  Request made of family/significant other to: Remove weapons (e.g., guns, rifles, knives), all items previously/currently identified as safety concern.   Remove drugs/medications (over-the-counter, prescriptions, illicit drugs), all items previously/currently identified as a safety concern.  The family member/significant other verbalizes understanding of the suicide prevention education information provided.  The family member/significant other agrees to remove the items of safety concern listed above.  Mother stated that the break-up with his girlfriend (and mother of his child) is the reason for patient's crisis.  The girlfriend is refusing to talk to him, so mother is being used at the "go-between."  Burgess Estelle, he made her repeat over and over what the girlfriend said, even though she kept saying the same thing repeatedly, and mother thinks he just wants to hear different answers.  CSW encouraged mother to set boundaries with him  and to tell him he cannot do the same with girlfriend if they do reconcile.  She stated that girlfriend broke up because he needs to spend more time with her and not ignore her.  CSW reiterated the various risk factors for suicide that were and might in the future be in his life.  The patient does not live with mother but does talk with her frequently.  There are no guns in the home where he lives.    Carloyn Jaeger Grossman-Orr 09/11/2020, 4:19 PM

## 2020-09-11 NOTE — [facility] Group Notes (Signed)
[facility] LCSW Group Therapy Note  Date/Time:  09/11/2020 9:00-10:00 or 10:00-11:00AM  Type of Therapy and Topic:  Group Therapy:  Healthy and Unhealthy Supports  Participation Level:  Active   Description of Group:  Patients in this group were introduced to the idea of adding a variety of healthy supports to address the various needs in their lives.Patients discussed what additional healthy supports could be helpful in their recovery and wellness after discharge in order to prevent future hospitalizations.   An emphasis was placed on using counselor, doctor, therapy groups, 12-step groups, and problem-specific support groups to expand supports.  They also worked as a group on developing a specific plan for several patients to deal with unhealthy supports through boundary-setting, psychoeducation with loved ones, and even termination of relationships.   Therapeutic Goals:   1)  discuss importance of adding supports to stay well once out of the hospital  2)  compare healthy versus unhealthy supports and identify some examples of each  3)  generate ideas and descriptions of healthy supports that can be added  4)  offer mutual support about how to address unhealthy supports  5)  encourage active participation in and adherence to discharge plan    Summary of Patient Progress:  The patient stated that current healthy supports in his life are "myself" while current unhealthy supports include "me".  The patient expressed a uncertainty about adding therapy as support(s) to help in her recovery journey.   Therapeutic Modalities:   Motivational Interviewing Brief Solution-Focused Therapy  Evorn Gong

## 2020-09-11 NOTE — Progress Notes (Signed)
[facility] MD Progress Note  09/11/2020 11:41 AM Richard [name]  MRN:  [id number]  Subjective:   [name] stated " I am doing better today. I slept good last night, I took some Trazodone."   Objective: [name] 20 year old African-American male was admitted due to suicide attempt.  Patient continues to deny this was a suicide attempt.  He stated  " I was trying to get my mother's attention, I wanted to talk to her about my problem but did not know how to do so."  Denied a history of previous inpatient admissions.  Denied that his been followed by therapy and/or psychiatry.  Stated he was just trying to talk to his mom due to verbal altercation between him and his child's mother.   Evaluation on the unit: Patient reported that he slept well last night after he took some Trazodone. He slept 5.25 hours. He reported a good appetite. He stated he does not like to take medication because he likes to meditate and do natural things to keep his mind right. 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 Patient stated he used to be pretty bad when he was younger and [place]. He got into trouble at school a lot and was selling some drugs. He stated when he came to [name] he changed the way he handled things and got his mind right. He stated he goes to work, comes home and meditates and takes care of his family. With his work schedule, investing and wife leaving him, he was overwhelmed and placed the belt around his neck for attention.  [name] continues to decline any psychotropic medication for mood stabilization.  Denies substance abuse history/use.  Patient is calm and cooperative. He is attending group therapy. Stated he is willing to follow-up with therapy at discharge. Anticipated discharged on Monday. Support, encouragement and reassurance was provided.  Principal Problem: MDD (major depressive disorder),  severe (HCC) Diagnosis: Principal Problem:   MDD (major depressive disorder), severe (HCC) Active Problems:   MDD (major depressive disorder), single episode, severe , no psychosis (HCC)  Total Time spent with patient: 15 minutes  Past Psychiatric History:   Past Medical History:  Past Medical History:  Diagnosis Date   Chlamydia    Scoliosis    Seizure (HCC) 03/20/2019   History reviewed. No pertinent surgical history. Family History:  Family History  Problem Relation Age of Onset   Sickle cell anemia Mother    CVA Mother        CASUSED BY SICKLE CELL    Seizures Mother        after cva   Healthy Father    Family Psychiatric  History:  Social History:  Social History   Substance and Sexual Activity  Alcohol Use Not Currently     Social History   Substance and Sexual Activity  Drug Use Never    Social History   Socioeconomic History   Marital status: Significant Other    Spouse name: Not on file   Number of children: Not on file   Years of education: Not on file   Highest education level: Not on file  Occupational History   Not on file  Tobacco Use   Smoking status: Every Day    Packs/day: 0.20    Pack years: 0.00    Types: Cigarettes   Smokeless tobacco: Never  Tobacco comments:    2 cigs per day  Vaping Use   Vaping Use: Never used  Substance and Sexual Activity   Alcohol use: Not Currently   Drug use: Never   Sexual activity: Yes    Birth control/protection: None  Other Topics Concern   Not on file  Social History Narrative   Not on file   Social Determinants of Health   Financial Resource Strain: Not on file  Food Insecurity: Not on file  Transportation Needs: Not on file  Physical Activity: Not on file  Stress: Not on file  Social Connections: Not on file   Additional Social History:     Sleep: Good  Appetite:  Fair  Current Medications: Current Facility-Administered Medications  Medication Dose Route Frequency Provider Last  Rate Last Admin   acetaminophen (TYLENOL) tablet 650 mg  650 mg Oral Q6H PRN Novella Olive, NP       alum & mag hydroxide-simeth (MAALOX/MYLANTA) 200-200-20 MG/5ML suspension 30 mL  30 mL Oral Q4H PRN Novella Olive, NP       magnesium hydroxide (MILK OF MAGNESIA) suspension 30 mL  30 mL Oral Daily PRN Novella Olive, NP       nicotine (NICODERM CQ - dosed in mg/24 hours) patch 21 mg  21 mg Transdermal Q0600 Novella Olive, NP       traZODone (DESYREL) tablet 50 mg  50 mg Oral QHS PRN Novella Olive, NP   50 mg at 09/10/20 2141    Lab Results:  No results found for this or any previous visit (from the past 48 hour(s)).   Blood Alcohol level:  Lab Results  Component Value Date   ETH <10 09/06/2020   ETH <10 03/20/2019    Metabolic Disorder Labs: Lab Results  Component Value Date   HGBA1C 4.5 (L) 09/08/2020   MPG 82.45 09/08/2020   No results found for: PROLACTIN Lab Results  Component Value Date   CHOL 159 09/08/2020   TRIG 50 09/08/2020   HDL 54 09/08/2020   CHOLHDL 2.9 09/08/2020   VLDL 10 09/08/2020   LDLCALC 95 09/08/2020    Physical Findings: AIMS: Facial and Oral Movements Muscles of Facial Expression: None, normal Lips and Perioral Area: None, normal Jaw: None, normal Tongue: None, normal,Extremity Movements Upper (arms, wrists, hands, fingers): None, normal Lower (legs, knees, ankles, toes): None, normal, Trunk Movements Neck, shoulders, hips: None, normal, Overall Severity Severity of abnormal movements (highest score from questions above): None, normal Incapacitation due to abnormal movements: None, normal Patient's awareness of abnormal movements (rate only patient's report): No Awareness, Dental Status Current problems with teeth and/or dentures?: No Does patient usually wear dentures?: No  CIWA:    COWS:     Musculoskeletal: Strength & Muscle Tone: within normal limits Gait & Station: normal Patient leans: N/A  Psychiatric Specialty  Exam:  Presentation  General Appearance: Disheveled; Other (comment) (Presents guarded, not forth-coming with information.)  Eye Contact:Good  Speech:Clear and Coherent; Normal Rate  Speech Volume:Normal  Handedness:Right   Mood and Affect  Mood:Euthymic (Denies any symptoms of depression or anxiety)  Affect:Appropriate   Thought Process  Thought Processes:Coherent; Linear; Goal Directed  Descriptions of Associations:Intact  Orientation:Full (Time, Place and Person)  Thought Content:Logical  History of Schizophrenia/Schizoaffective disorder:No  Duration of Psychotic Symptoms:No data recorded Hallucinations:No data recorded  Ideas of Reference:None  Suicidal Thoughts:No data recorded  Homicidal Thoughts:No data recorded   Sensorium  Memory:Immediate Good; Recent Good; Remote Good  Judgment:Fair  Insight:Lacking   Executive Functions  Concentration:Good  Attention Span:Good  Recall:Good  Fund of Knowledge:Fair  Language:Good   Psychomotor Activity  Psychomotor Activity:No data recorded   Assets  Assets:Physical Health; Social Support; Housing; Vocational/Educational; Communication Skills   Sleep  Sleep:No data recorded    Physical Exam: Physical Exam Vitals reviewed.  Cardiovascular:     Rate and Rhythm: Normal rate.     Pulses: Normal pulses.  Neurological:     Mental Status: He is alert.  Psychiatric:        Attention and Perception: Attention normal.        Mood and Affect: Mood normal.        Speech: Speech normal.        Behavior: Behavior normal. Behavior is cooperative.        Thought Content: Thought content normal.        Cognition and Memory: Cognition normal.        Judgment: Judgment normal.   ROS Blood pressure 120/78, pulse 79, temperature 98.1 F (36.7 C), temperature source Oral, resp. rate 16, height 5\' 6"  (1.676 m), weight 55.8 kg, SpO2 100 %. Body mass index is 19.85 kg/m.   Treatment Plan  Summary: Daily contact with patient to assess and evaluate symptoms and progress in treatment  Continue with current treatment plan on 09/10/2020 as listed below except were noted  Continue trazodone 50 mg p.o. as needed nightly for insomnia  CSW to continue working on discharge disposition Patient encouraged to participate within the therapeutic milieu   09/12/2020, NP 09/11/2020, 4:45 PM

## 2020-09-12 MED ORDER — TRAZODONE HCL 50 MG PO TABS: 50.0000 mg | ORAL_TABLET | Freq: Every evening | ORAL | 0 refills | Status: DC | PRN

## 2020-09-12 NOTE — Progress Notes (Signed)
Patient has been up and active on the unit, attended group this evening and has voiced no complaints. He is hopeful to discharge on tomorrow.Patient currently denies having pain, -si/hi/a/v hall. Support and encouragement offered, safety maintained on unit, will continue to monitor.

## 2020-09-12 NOTE — [facility] Counselor (Signed)
CSW received the paperwork from [employer] for the physician to fill out.  CSW provided a copy for Dr. Mason Jim and one copy for Dr. Lucianne Muss.  CSW will check back in with the doctors to make sure that the paperwork is completed and faxed back to [employer] for the patient's FMLA.

## 2020-09-12 NOTE — [facility] Suicide Risk Assessment (Signed)
[facility] Discharge Suicide Risk Assessment   Principal Problem: MDD (major depressive disorder), severe (HCC) Discharge Diagnoses: Principal Problem:   MDD (major depressive disorder), severe (HCC) Active Problems:   MDD (major depressive disorder), single episode, severe , no psychosis (HCC)  Total Time Spent in Direct Patient Care:  I personally spent 30 minutes on the unit in direct patient care. The direct patient care time included face-to-face time with the patient, reviewing the patient's chart, communicating with other professionals, and coordinating care. Greater than 50% of this time was spent in counseling or coordinating care with the patient regarding goals of hospitalization, psycho-education, and discharge planning needs.  Subjective: Patient seen on rounds. He states he feels ready for discharge today and denies SI, HI, AVH, or paranoia. He continues to decline start of psychotropic medications but is willing to start psychotherapy after discharge. He can articulate a safety and after-care plan. He reports fair sleep and stable appetite and voices no physical complaints today. He states his mood is stable and improved and we discussed some of the anticipatory anxiety he has about facing his stressors once he is discharge. He was encouraged to abstain from alcohol, tobacco, or illicit substances after discharge. He was advised to see a PCP for recheck of elevated bilirubin without fail after discharge. Time was given for questions.   Musculoskeletal: Strength & Muscle Tone: within normal limits Gait & Station: normal, steady Patient leans: N/A  Psychiatric Specialty Exam: Physical Exam Vitals reviewed.  HENT:     Head: Normocephalic.  Pulmonary:     Effort: Pulmonary effort is normal.  Neurological:     General: No focal deficit present.     Mental Status: He is alert.    Review of Systems  Respiratory:  Negative for shortness of breath.   Cardiovascular:  Negative for chest  pain.  Gastrointestinal:  Negative for diarrhea, nausea and vomiting.   Blood pressure 100/68, pulse 67, temperature 98.6 F (37 C), temperature source Oral, resp. rate 16, height 5\' 6"  (1.676 m), weight 55.8 kg, SpO2 100 %.Body mass index is 19.85 kg/m.  General Appearance:  casually dressed, good hygiene  Eye Contact:  Good  Speech:  Clear and Coherent and Normal Rate  Volume:  Normal  Mood:  Anxious  Affect:  Congruent  Thought Process:  Goal Directed and Linear  Orientation:  Full (Time, Place, and Person)  Thought Content:  Logical and no evidence of psychosis, paranoia, or delusions  Suicidal Thoughts:  No  Homicidal Thoughts:  No  Memory:  Recent;   Good  Judgement:  Fair  Insight:  Fair  Psychomotor Activity:  Normal  Concentration:  Concentration: Good and Attention Span: Good  Recall:  Good  Fund of Knowledge:  Good  Language:  Good  Akathisia:  Negative  Assets:  Communication Skills Desire for Improvement Housing Physical Health Resilience Social Support Vocational/Educational  ADL's:  Intact  Cognition:  WNL  Sleep:  Number of Hours: 5.5   Mental Status Per Nursing Assessment::   On Admission:  Suicidal ideation indicated by patient  Demographic Factors:  Male and Adolescent or young adult  Loss Factors: Relationship stressors  Historical Factors: Suicide attempt prior to admission  Risk Reduction Factors:   Sense of responsibility to family and Positive social support  Continued Clinical Symptoms:  Depression:   Impulsivity  Cognitive Features That Contribute To Risk:  Closed-mindedness    Suicide Risk:  Mild:  Suicide attempt prior to admission, young adult male with h/o  impulsivity, refusing medications; currently no SI, intent or plan with identifiable social supports and agreement for outpatient f/u   Follow-up Information     [facility]. Go to.   Specialty: Behavioral Health Why: Please go to this  provider for therapy and medication management services during walk in hours:  Monday through Wednesday, from 8:00 am to 11:00 am.  Services are provided on a first come, first served basis. Contact information: [address] [place] [address] [phone]                Plan Of Care/Follow-up recommendations:  Activity:  as tolerated Diet:  heart healthy Other:  Patient advised to keep scheduled outpatient follow up to start psychotherapy without fail. He was encouraged to abstain from alcohol, tobacco, and illicit substances after discharge. He was advised to see a primary care provider without fail for recheck of his mildly elevated total bilirubin.  Comer Locket, MD, FAPA 09/12/2020, 7:45 AM

## 2020-09-12 NOTE — Progress Notes (Signed)
  [facility] Adult Case Management Discharge Plan :  Will you be returning to the same living situation after discharge:  Yes,  Home At discharge, do you have transportation home?: Yes,  Safe Transport Do you have the ability to pay for your medications: Yes,  Family and employment   Release of information consent forms completed and in the chart;  Patient's signature needed at discharge.  Patient to Follow up at:  Follow-up Information     [facility]. Go to.   Specialty: Behavioral Health Why: Please go to this provider for therapy and medication management services during walk in hours:  Monday through Wednesday, from 8:00 am to 11:00 am.  Services are provided on a first come, first served basis. Contact information: [address] [place] [address] [phone]        [facility]. Call.   Why: Please contact this office to schedule a primary care follow up appointment. Contact information: [address] [place] [address] [phone]                Next level of care provider has access to [facility] Link:yes  Safety Planning and Suicide Prevention discussed: Yes,  with patient and mother   Have you used any form of tobacco in the last 30 days? (Cigarettes, Smokeless Tobacco, Cigars, and/or Pipes): No  Has patient been referred to the Quitline?: N/A patient is not a smoker  Patient has been referred for addiction treatment: N/A  Aram Beecham, LCSWA 09/12/2020, 11:01 AM

## 2020-09-12 NOTE — Discharge Summary (Signed)
Physician Discharge Summary Note  Patient:  Richard [name] is an 20 y.o., male MRN:  [id number] DOB:  Jul 06, 2000 Patient phone:  [phone] (home)  Patient address:   [address] [place] [address],  Total Time spent with patient: 30 minutes  Date of Admission:  09/07/2020 Date of Discharge: 09/12/2020  Reason for Admission:  (From MD's admission note):  This is the first psychiatric admission in this [facility] for this 20 year old AA male with no known hx of mental health or substance abuse issues. He is admitted to the [facility] from the [facility] with complain of suicide attempt by strangulation with a belt around the neck & an attempt to jump off a 3 storey window. Patient was apparently found by his uncle with a belt around his neck while trying to jump off a 3 storey window. He was brought to the hospital for evaluation & treatment. During this assessment, Richard [name] reports, "The EMS took me to the [facility] on Tuesday, the 21st of this month. I left the hospital & came here. My mom called the EMS on that day. She said I had thoughts of hurting myself. But, I was not thinking about or trying to hurt myself. My intention was to get my mother's attention so that we can talk. And the trick worked. What I did was, I put a belt around my neck, my mom saw me & panicked, called 911. I planned putting a belt around my neck an hour prior to doing it. I just wanted to have a conversation with my mother, that was it. I have never done this kind of stuff in the past. I have never attempted suicide in any other way or form. I have never felt like hurting myself & I will never hurt myself or anyone else. I have been stressing about work & relationship stuff. 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 Then, there is this disagreement between me & my baby-mama. We cannot seem to agree on anything. My baby girl is 56 months old. I'm not depressed prior to all of these. I have never  been evaluated for mental issues or treated for one. I'm currently not taking any medicines. There is no one in my family that has ever committed suicide. I do not need any medication for depression or anything else. I feel good".   Principal Problem: MDD (major depressive disorder), severe (HCC) Discharge Diagnoses: Principal Problem:   MDD (major depressive disorder), severe (HCC) Active Problems:   MDD (major depressive disorder), single episode, severe , no psychosis (HCC)   Past Psychiatric History: See H&P  Past Medical History:  Past Medical History:  Diagnosis Date   Chlamydia    Scoliosis    Seizure (HCC) 03/20/2019   History reviewed. No pertinent surgical history. Family History:  Family History  Problem Relation Age of Onset   Sickle cell anemia Mother    CVA Mother        CASUSED BY SICKLE CELL    Seizures Mother        after cva   Healthy Father    Family Psychiatric  History: See H&P Social History:  Social History   Substance and Sexual Activity  Alcohol Use Not Currently     Social History   Substance and Sexual Activity  Drug Use Never    Social History   Socioeconomic History   Marital status: Significant Other    Spouse  name: Not on file   Number of children: Not on file   Years of education: Not on file   Highest education level: Not on file  Occupational History   Not on file  Tobacco Use   Smoking status: Every Day    Packs/day: 0.20    Pack years: 0.00    Types: Cigarettes   Smokeless tobacco: Never   Tobacco comments:    2 cigs per day  Vaping Use   Vaping Use: Never used  Substance and Sexual Activity   Alcohol use: Not Currently   Drug use: Never   Sexual activity: Yes    Birth control/protection: None  Other Topics Concern   Not on file  Social History Narrative   Not on file   Social Determinants of Health   Financial Resource Strain: Not on file  Food Insecurity: Not on file  Transportation Needs: Not on file   Physical Activity: Not on file  Stress: Not on file  Social Connections: Not on file    Hospital Course:  After the above admission evaluation, Richard [name]'s presenting symptoms were noted. He was recommended for mood stabilization treatments. The medication regimen targeting those presenting symptoms were discussed with him. He elected not to take any medications while he was in the hospital.  His UDS and BAL were negative. He was recommended to seek out therapy when he was discharged. Besides the mood stabilization treatments, Richard [name] was also enrolled & participated in the group counseling sessions being offered & held on this unit. He learned coping skills. He presented no other significant pre-existing medical issues that required treatment. He tolerated his treatment regimen without any adverse effects or reactions reported.   During the course of his hospitalization, the 15-minute checks were adequate to ensure patient's safety. Richard [name] did not display any dangerous, violent or suicidal behavior on the unit.  He interacted with patients & staff appropriately, participated appropriately in the group sessions/therapies. His medications were addressed & adjusted to meet his needs. He was recommended for outpatient follow-up care & medication management, should he decide to take medication in the future, upon discharge to assure continuity of care & mood stability.  At the time of discharge patient is not reporting any acute suicidal/homicidal ideations. He feels more confident about his self-care & in managing his mental health. He currently denies any new issues or concerns. Education and supportive counseling provided throughout his hospital stay & upon discharge.   Today upon his discharge evaluation with the attending psychiatrist, Richard [name] shares he is doing well. He denies any other specific concerns. He is sleeping well. His appetite is good. He denies other physical complaints. He denies AH/VH, delusional  thoughts or paranoia. He does not appear to be responding to any internal stimuli. He was able to engage in safety planning including plan to return to [facility] or contact emergency services if he feels unable to maintain his/her own safety or the safety of others. Pt had no further questions, comments, or concerns. He left [facility] with all personal belongings in no apparent distress. Transportation home per [employer].    Physical Findings: AIMS: Facial and Oral Movements Muscles of Facial Expression: None, normal Lips and Perioral Area: None, normal Jaw: None, normal Tongue: None, normal,Extremity Movements Upper (arms, wrists, hands, fingers): None, normal Lower (legs, knees, ankles, toes): None, normal, Trunk Movements Neck, shoulders, hips: None, normal, Overall Severity Severity of abnormal movements (highest score from questions above): None, normal Incapacitation due to abnormal movements: None,  normal Patient's awareness of abnormal movements (rate only patient's report): No Awareness, Dental Status Current problems with teeth and/or dentures?: No Does patient usually wear dentures?: No  CIWA:    COWS:     Musculoskeletal: Strength & Muscle Tone: within normal limits Gait & Station: normal Patient leans: N/A   Psychiatric Specialty Exam:  Presentation  General Appearance: Appropriate for Environment; Fairly Groomed (Presents guarded, not forth-coming with information.)  Eye Contact:Good  Speech:Clear and Coherent; Normal Rate  Speech Volume:Normal  Handedness:Right   Mood and Affect  Mood:Euthymic (Denies any symptoms of depression or anxiety)  Affect:Appropriate; Congruent   Thought Process  Thought Processes:Coherent; Goal Directed  Descriptions of Associations:Intact  Orientation:Full (Time, Place and Person)  Thought Content:Logical  History of Schizophrenia/Schizoaffective disorder:No  Duration of Psychotic Symptoms:No data  recorded Hallucinations:No data recorded Ideas of Reference:None  Suicidal Thoughts:No data recorded Homicidal Thoughts:No data recorded  Sensorium  Memory:Immediate Good; Recent Good; Remote Good  Judgment:Fair  Insight:Fair   Executive Functions  Concentration:Good  Attention Span:Good  Recall:Good  Fund of Knowledge:Good  Language:Good   Psychomotor Activity  Psychomotor Activity: No data recorded  Assets  Assets:Physical Health; Social Support; Housing; Advertising copywriterVocational/Educational; Manufacturing systems engineerCommunication Skills; Desire for Improvement; Financial Resources/Insurance  Sleep  Sleep: No data recorded  Physical Exam: Physical Exam Vitals and nursing note reviewed.  Constitutional:      Appearance: Normal appearance.  HENT:     Head: Normocephalic.  Pulmonary:     Effort: Pulmonary effort is normal.  Musculoskeletal:        General: Normal range of motion.     Cervical back: Normal range of motion.  Neurological:     General: No focal deficit present.     Mental Status: He is alert and oriented to person, place, and time.   Review of Systems  Constitutional: Negative.  Negative for fever.  HENT: Negative.  Negative for congestion, sinus pain and sore throat.   Respiratory: Negative.  Negative for cough and shortness of breath.   Cardiovascular: Negative.  Negative for chest pain.  Gastrointestinal: Negative.   Genitourinary: Negative.   Musculoskeletal: Negative.   Neurological: Negative.   Blood pressure 100/68, pulse 67, temperature 98.6 F (37 C), temperature source Oral, resp. rate 16, height 5\' 6"  (1.676 m), weight 55.8 kg, SpO2 100 %. Body mass index is 19.85 kg/m.   Social History   Tobacco Use  Smoking Status Every Day   Packs/day: 0.20   Pack years: 0.00   Types: Cigarettes  Smokeless Tobacco Never  Tobacco Comments   2 cigs per day   Tobacco Cessation:  N/A, patient does not currently use tobacco products   Blood Alcohol level:  Lab Results   Component Value Date   ETH <10 09/06/2020   ETH <10 03/20/2019    Metabolic Disorder Labs:  Lab Results  Component Value Date   HGBA1C 4.5 (L) 09/08/2020   MPG 82.45 09/08/2020   No results found for: PROLACTIN Lab Results  Component Value Date   CHOL 159 09/08/2020   TRIG 50 09/08/2020   HDL 54 09/08/2020   CHOLHDL 2.9 09/08/2020   VLDL 10 09/08/2020   LDLCALC 95 09/08/2020    See Psychiatric Specialty Exam and Suicide Risk Assessment completed by Attending Physician prior to discharge.  Discharge destination:  Home  Is patient on multiple antipsychotic therapies at discharge:  No   Has Patient had three or more failed trials of antipsychotic monotherapy by history:  No  Recommended Plan for Multiple  Antipsychotic Therapies: NA  Discharge Instructions     Diet - low sodium heart healthy   Complete by: As directed    Increase activity slowly   Complete by: As directed       Allergies as of 09/12/2020   No Known Allergies      Medication List    You have not been prescribed any medications.     Follow-up Information     [facility]. Go to.   Specialty: Behavioral Health Why: Please go to this provider for therapy and medication management services during walk in hours:  Monday through Wednesday, from 8:00 am to 11:00 am.  Services are provided on a first come, first served basis. Contact information: [address] [place] [address] [phone]        [facility]. Call.   Why: Please contact this office to schedule a primary care follow up appointment. Contact information: [address] [place] [address] [phone]                Follow-up recommendations:  Activity:  as tolerated Diet:  Heart healthy  Comments:  Prescriptions were given at discharge.  Patient is agreeable with the discharge plan.  He was given opportunity to ask questions.   He appears to feel comfortable with discharge denies any current suicidal or homicidal thoughts.   Patient is instructed prior to discharge to: Take all medications as prescribed by her mental healthcare provider. Report any adverse effects and or reactions from the medicines to her outpatient provider promptly. Patient has been instructed & cautioned: To not engage in alcohol and or illegal drug use while on prescription medicines. In the event of worsening symptoms, patient is instructed to call the crisis hotline, 911 and or go to the nearest ED for appropriate evaluation and treatment of symptoms. To follow-up with her primary care provider for your other medical issues, concerns and or health care needs.   Signed: Laveda Abbe, NP 09/12/2020, 6:33 PM

## 2020-09-12 NOTE — Progress Notes (Signed)
Adult Psychoeducational Group Note  Date:  09/12/2020 Time:  9:17 AM  Group Topic/Focus:  Goals Group:   The focus of this group is to help patients establish daily goals to achieve during treatment and discuss how the patient can incorporate goal setting into their daily lives to aide in recovery.  Participation Level:  Active  Participation Quality:  Appropriate  Affect:  Appropriate  Cognitive:  Appropriate  Insight: Appropriate  Engagement in Group:  Engaged  Modes of Intervention:  Discussion  Additional Comments:  Pt attended group and participated in discussion.  Pt states that he wants to get better everyday.  Savreen Gebhardt R Amelda Hapke 09/12/2020, 9:17 AM

## 2020-09-12 NOTE — Progress Notes (Signed)
Recreation Therapy Notes  Date:  6.27.22 Time: 0930  Location: [address] Dayroom  Group Topic: Stress Management  Goal Area(s) Addresses:  Patient will identify positive stress management techniques. Patient will identify benefits of using stress management post d/c.  Intervention: Stress Management  Activity : Meditation.  LRT played a meditation that focused on being resilient when faced with adversity.     Education:  Stress Management, Discharge Planning.   Education Outcome: Acknowledges Education  Clinical Observations/Feedback: Pt did not attend group.    Karry Causer, LRT/CTRS         Shahira Fiske A 09/12/2020 12:46 PM 

## 2020-09-12 NOTE — BHH Counselor (Signed)
CSW contacted [employer] again to have the FMLA paperwork sent to the fax [phone] so the doctor can fill it out and send it back.  CSW had contacted [employer] on Thursday and was informed that paperwork would be sent within 48 hours.  CSW has not received this paperwork.  CSW asked the receptionist again about sending this paperwork and she stated that it would be sent in 24 hours.  The receptionist also sent the CSW to a voicemail for [employer].  CSW left a voicemail asking for this paperwork to be sent by Tuesday afternoon.  CSW will continue to look for this paperwork and will provide it to the doctor once it is received.

## 2020-09-12 NOTE — BHH Group Notes (Signed)
Patient did not attend group   ADULT GRIEF GROUP NOTE:   Spiritual care group on grief and loss facilitated by chaplain Katy Jestina Stephani, BCC   Group Goal:   Support / Education around grief and loss   Members engage in facilitated group support and psycho-social education.   Group Description:   Following introductions and group rules, group members engaged in facilitated group dialog and support around topic of loss, with particular support around experiences of loss in their lives. Group Identified types of loss (relationships / self / things) and identified patterns, circumstances, and changes that precipitate losses. Reflected on thoughts / feelings around loss, normalized grief responses, and recognized variety in grief experience. Group noted Worden's four tasks of grief in discussion.   Group drew on Adlerian / Rogerian, narrative, MI,    

## 2020-09-12 NOTE — Progress Notes (Signed)
Pt discharged to lobby. Pt was stable and appreciative at that time. All papers, samples and prescriptions were given and valuables returned. Verbal understanding expressed. Denies SI/HI and A/VH. Pt given opportunity to express concerns and ask questions.  

## 2020-09-12 NOTE — Progress Notes (Signed)
  [facility] Adult Case Management Discharge Plan :  Will you be returning to the same living situation after discharge:  Yes,  Home At discharge, do you have transportation home?: Yes,  Safe Transport  Do you have the ability to pay for your medications: Yes,  Family and employment   Release of information consent forms completed and in the chart;  Patient's signature needed at discharge.  Patient to Follow up at:  Follow-up Information     [facility]. Go to.   Specialty: Behavioral Health Why: Please go to this provider for therapy and medication management services during walk in hours:  Monday through Wednesday, from 8:00 am to 11:00 am.  Services are provided on a first come, first served basis. Contact information: [address] [place] [address] [phone]                Next level of care provider has access to [facility] Link:yes  Safety Planning and Suicide Prevention discussed: Yes,  with patient and mother   Have you used any form of tobacco in the last 30 days? (Cigarettes, Smokeless Tobacco, Cigars, and/or Pipes): No  Has patient been referred to the Quitline?: N/A patient is not a smoker  Patient has been referred for addiction treatment: N/A  Aram Beecham, LCSWA 09/12/2020, 10:15 AM

## 2021-03-05 ENCOUNTER — Other Ambulatory Visit: Payer: Self-pay

## 2021-03-05 ENCOUNTER — Ambulatory Visit (HOSPITAL_COMMUNITY)
Admission: EM | Admit: 2021-03-05 | Discharge: 2021-03-05 | Disposition: A | Discharge status: Home / Self Care | Payer: Self-pay | Attending: Family Medicine | Admitting: Family Medicine

## 2021-03-05 ENCOUNTER — Encounter (HOSPITAL_COMMUNITY): Payer: Self-pay

## 2021-03-05 DIAGNOSIS — N342 Other urethritis: Secondary | ICD-10-CM | POA: Insufficient documentation

## 2021-03-05 MED ORDER — DOXYCYCLINE HYCLATE 100 MG PO CAPS: 100.0000 mg | ORAL_CAPSULE | Freq: Two times a day (BID) | ORAL | 0 refills | Status: AC

## 2021-03-05 NOTE — ED Provider Notes (Signed)
[facility]    CSN: [id number] Arrival date & time: 03/05/21  1136      History   Chief Complaint Chief Complaint  Patient presents with   Penile Discharge    HPI Richard [name] is a 20 y.o. male.    Penile Discharge  Here with penile dc for about 3 days. Noted some genital pain, mild at first, in the last 5 days or so, now about a 5/10. No itching or rash. No f/c/n/v/d. No URI symptoms.  Reports h/o STI in the past; is disappointed that I am not going to treat him for both GC and chlamydia now, so that he doesn't have to come back. Past Medical History:  Diagnosis Date   Chlamydia    Scoliosis    Seizure (HCC) 03/20/2019    Patient Active Problem List   Diagnosis Date Noted   Suicide attempt by hanging (HCC) 09/07/2020   MDD (major depressive disorder), severe (HCC) 09/07/2020   MDD (major depressive disorder), single episode, severe , no psychosis (HCC) 09/07/2020   Seizure-like activity (HCC) 03/21/2019   Seizure (HCC) 03/20/2019    History reviewed. No pertinent surgical history.     Home Medications    Prior to Admission medications   Medication Sig Start Date End Date Taking? Authorizing Provider  doxycycline (VIBRAMYCIN) 100 MG capsule Take 1 capsule (100 mg total) by mouth 2 (two) times daily for 7 days. 03/05/21 03/12/21 Yes Zenia Resides, MD  gabapentin (NEURONTIN) 100 MG capsule Take 1 capsule (100 mg total) by mouth 3 (three) times daily. For anxiety or nerve pain 07/17/19 11/05/19  Eustace Moore, MD    Family History Family History  Problem Relation Age of Onset   Sickle cell anemia Mother    CVA Mother        CASUSED BY SICKLE CELL    Seizures Mother        after cva   Healthy Father     Social History Social History   Tobacco Use   Smoking status: Every Day    Packs/day: 0.20    Types: Cigarettes   Smokeless tobacco: Never   Tobacco comments:    2 cigs per day  Vaping Use   Vaping Use: Never used  Substance Use  Topics   Alcohol use: Not Currently   Drug use: Never     Allergies   Patient has no known allergies.   Review of Systems Review of Systems  Genitourinary:  Positive for penile discharge.    Physical Exam Triage Vital Signs ED Triage Vitals  Enc Vitals Group     BP 03/05/21 1300 114/66     Pulse Rate 03/05/21 1300 (!) 58     Resp 03/05/21 1300 17     Temp 03/05/21 1304 98.3 F (36.8 C)     Temp Source 03/05/21 1304 Oral     SpO2 03/05/21 1300 100 %     Weight --      Height --      Head Circumference --      Peak Flow --      Pain Score 03/05/21 1259 5     Pain Loc --      Pain Edu? --      Excl. in GC? --    No data found.  Updated Vital Signs BP 114/66 (BP Location: Right Arm)    Pulse (!) 58    Temp 98.3 F (36.8 C) (Oral)    Resp 17  SpO2 100%   Visual Acuity Right Eye Distance:   Left Eye Distance:   Bilateral Distance:    Right Eye Near:   Left Eye Near:    Bilateral Near:     Physical Exam Vitals reviewed.  Constitutional:      General: He is not in acute distress. HENT:     Mouth/Throat:     Mouth: Mucous membranes are moist.  Cardiovascular:     Rate and Rhythm: Normal rate and regular rhythm.     Heart sounds: No murmur heard. Pulmonary:     Breath sounds: Normal breath sounds.  Abdominal:     Palpations: Abdomen is soft.     Tenderness: There is no abdominal tenderness.  Neurological:     Mental Status: He is alert and oriented to person, place, and time.  Psychiatric:        Behavior: Behavior normal.     UC Treatments / Results  Labs (all labs ordered are listed, but only abnormal results are displayed) Labs Reviewed  CYTOLOGY, (ORAL, ANAL, URETHRAL) ANCILLARY ONLY    EKG   Radiology No results found.  Procedures Procedures (including critical care time)  Medications Ordered in UC Medications - No data to display  Initial Impression / Assessment and Plan / UC Course  I have reviewed the triage vital signs and  the nursing notes.  Pertinent labs & imaging results that were available during my care of the patient were reviewed by me and considered in my medical decision making (see chart for details).     Possible chlamydia. Self swab today for GC/Chlamydia/trich. Will treat empirically for chlamydia, and discussed why we want to test before treating for the gonorrhea. Final Clinical Impressions(s) / UC Diagnoses   Final diagnoses:  Urethritis     Discharge Instructions      Take doxycycline 100 mg 1 twice daily for 1 week. Our staff will call you if any result is positive, to get you back in for treatment. Also, you can check the mychart for results.  No intercourse for 2 weeks.     ED Prescriptions     Medication Sig Dispense Auth. Provider   doxycycline (VIBRAMYCIN) 100 MG capsule Take 1 capsule (100 mg total) by mouth 2 (two) times daily for 7 days. 14 capsule Marlinda Mike, Janace Aris, MD      PDMP not reviewed this encounter.   Zenia Resides, MD 03/05/21 [phone]

## 2021-03-05 NOTE — Discharge Instructions (Addendum)
Take doxycycline 100 mg 1 twice daily for 1 week. Our staff will call you if any result is positive, to get you back in for treatment. Also, you can check the mychart for results.  No intercourse for 2 weeks.

## 2021-03-05 NOTE — ED Triage Notes (Signed)
Pt reports penile discharge x 3 days.   States earlier this week he felt pain in his testicles.

## 2021-03-06 LAB — CYTOLOGY, (ORAL, ANAL, URETHRAL) ANCILLARY ONLY
Chlamydia: NEGATIVE
Comment: NEGATIVE
Comment: NEGATIVE
Comment: NORMAL
Neisseria Gonorrhea: NEGATIVE
Trichomonas: NEGATIVE

## 2021-07-21 ENCOUNTER — Encounter (HOSPITAL_COMMUNITY): Payer: Self-pay | Admitting: Emergency Medicine

## 2021-07-21 ENCOUNTER — Ambulatory Visit (HOSPITAL_COMMUNITY)
Admission: EM | Admit: 2021-07-21 | Discharge: 2021-07-21 | Disposition: A | Discharge status: Home / Self Care | Payer: Self-pay | Attending: Physician Assistant | Admitting: Physician Assistant

## 2021-07-21 DIAGNOSIS — R369 Urethral discharge, unspecified: Secondary | ICD-10-CM | POA: Insufficient documentation

## 2021-07-21 LAB — POCT URINALYSIS DIPSTICK, ED / UC
Bilirubin Urine: NEGATIVE
Glucose, UA: NEGATIVE mg/dL
Hgb urine dipstick: NEGATIVE
Ketones, ur: NEGATIVE mg/dL
Leukocytes,Ua: NEGATIVE
Nitrite: NEGATIVE
Protein, ur: NEGATIVE mg/dL
Specific Gravity, Urine: >=1.03 (ref 1.005–1.030)
Urobilinogen, UA: 0.2 mg/dL (ref 0.0–1.0)
pH: 5.5 (ref 5.0–8.0)

## 2021-07-21 NOTE — ED Provider Notes (Signed)
?[facility] ? ? ? ?CSN: [id number] ?Arrival date & time: 07/21/21  1924 ? ? ?  ? ?History   ?Chief Complaint ?Chief Complaint  ?Patient presents with  ? Penile Discharge  ? SEXUALLY TRANSMITTED DISEASE  ? ? ?HPI ?Richard [name] is a 21 y.o. male.  ?Patient with penile discharge on and off for the past month.  He states it usually happens in the morning and sometimes is thick but can also be watery.  He has been tested in the past month for gonorrhea and chlamydia which have been negative.  He was instructed by his primary care to follow-up with urology if the symptoms continue.  He is requesting STD testing in clinic tonight but declines any blood testing. ?He is also worried about his right big toe having frostbite as he works in -20 degree weather at [employer].  He notes some numbness to the tip of his right big toe. ?Denies any dysuria, fever, genital lesions, rash. ? ? ?Past Medical History:  ?Diagnosis Date  ? Chlamydia   ? Scoliosis   ? Seizure (HCC) 03/20/2019  ? ? ?Patient Active Problem List  ? Diagnosis Date Noted  ? Suicide attempt by hanging (HCC) 09/07/2020  ? MDD (major depressive disorder), severe (HCC) 09/07/2020  ? MDD (major depressive disorder), single episode, severe , no psychosis (HCC) 09/07/2020  ? Seizure-like activity (HCC) 03/21/2019  ? Seizure (HCC) 03/20/2019  ? ? ?History reviewed. No pertinent surgical history. ? ? ? ?Home Medications   ? ?Prior to Admission medications   ?Medication Sig Start Date End Date Taking? Authorizing Provider  ?gabapentin (NEURONTIN) 100 MG capsule Take 1 capsule (100 mg total) by mouth 3 (three) times daily. For anxiety or nerve pain 07/17/19 11/05/19  Eustace Moore, MD  ? ? ?Family History ?Family History  ?Problem Relation Age of Onset  ? Sickle cell anemia Mother   ? CVA Mother   ?     CASUSED BY SICKLE CELL   ? Seizures Mother   ?     after cva  ? Healthy Father   ? ? ?Social History ?Social History  ? ?Tobacco Use  ? Smoking status: Every Day   ?  Packs/day: 0.20  ?  Types: Cigarettes  ? Smokeless tobacco: Never  ? Tobacco comments:  ?  2 cigs per day  ?Vaping Use  ? Vaping Use: Never used  ?Substance Use Topics  ? Alcohol use: Not Currently  ? Drug use: Never  ? ? ? ?Allergies   ?Patient has no known allergies. ? ? ?Review of Systems ?Review of Systems  ?Genitourinary:  Positive for penile discharge.  ?As per HPI ? ?Physical Exam ?Triage Vital Signs ?ED Triage Vitals  ?Enc Vitals Group  ?   BP 07/21/21 1945 129/64  ?   Pulse Rate 07/21/21 1945 (!) 53  ?   Resp 07/21/21 1945 18  ?   Temp 07/21/21 1947 98.3 ?F (36.8 ?C)  ?   Temp src --   ?   SpO2 07/21/21 1945 100 %  ?   Weight --   ?   Height --   ?   Head Circumference --   ?   Peak Flow --   ?   Pain Score 07/21/21 1945 0  ?   Pain Loc --   ?   Pain Edu? --   ?   Excl. in GC? --   ? ?No data found. ? ?Updated Vital Signs ?BP  129/64   Pulse (!) 53   Temp 98.3 ?F (36.8 ?C)   Resp 18   SpO2 100%  ? ? ?Physical Exam ?Vitals and nursing note reviewed.  ?Cardiovascular:  ?   Rate and Rhythm: Normal rate and regular rhythm.  ?   Heart sounds: Normal heart sounds.  ?Pulmonary:  ?   Effort: Pulmonary effort is normal.  ?   Breath sounds: Normal breath sounds.  ?Abdominal:  ?   General: Abdomen is flat.  ?   Tenderness: There is no abdominal tenderness.  ?Genitourinary: ?   Comments: Deferred - self swab ?Musculoskeletal:  ?   Comments: Exam is reassuring and right foot is entirely neurovascularly intact.  There is no discoloration of the big right toe and motor function and sensation is intact.  DP and PT pulses intact.  ?Neurological:  ?   Mental Status: He is alert.  ? ? ? ?UC Treatments / Results  ?Labs ?(all labs ordered are listed, but only abnormal results are displayed) ?Labs Reviewed  ?POCT URINALYSIS DIPSTICK, ED / UC  ?CYTOLOGY, (ORAL, ANAL, URETHRAL) ANCILLARY ONLY  ? ?EKG ? ?Radiology ?No results found. ? ?Procedures ?Procedures (including critical care time) ? ?Medications Ordered in  UC ?Medications - No data to display ? ?Initial Impression / Assessment and Plan / UC Course  ?I have reviewed the triage vital signs and the nursing notes. ? ?Pertinent labs & imaging results that were available during my care of the patient were reviewed by me and considered in my medical decision making (see chart for details). ?  ?Patient self swabbed in clinic tonight and cytology is currently pending.  Urinalysis is normal with no sign of infection.  Discussed with patient he will get his results on MyChart and treatment will be issued as necessary.   ?Discussed wearing wool socks at work and possibly using hand warmers in the shoes for the toe numbness.  Discussed with patient exam is reassuring and no concerns for frostbite of the toe. ?He is instructed to return if symptoms worsen or do not improve.  He will also follow-up with his primary care regarding referral to urology. ?Patient discharged in stable condition. ? ?Final Clinical Impressions(s) / UC Diagnoses  ? ?Final diagnoses:  ?Penile discharge  ? ? ? ?Discharge Instructions   ? ?  ?You will be notified on MyChart of your results. ? ?Please return if symptoms do not improve or if they worsen. ? ? ? ?ED Prescriptions   ?None ?  ? ?PDMP not reviewed this encounter. ?  ?Amarachukwu Lakatos, Lurena Joiner, PA-C ?07/21/21 2034 ? ?

## 2021-07-21 NOTE — ED Triage Notes (Signed)
Pt is present today with concerns of STD. Pt states that he noticed penile discharge x2 weeks ago.  ? ?Pt is also concern with possible frost bite on his right big toe. Pt states that he works in -17 degree temp at work and noticed numbness in his toe.  ?

## 2021-07-21 NOTE — Discharge Instructions (Signed)
You will be notified on MyChart of your results. ? ?Please return if symptoms do not improve or if they worsen. ?

## 2021-07-24 LAB — CYTOLOGY, (ORAL, ANAL, URETHRAL) ANCILLARY ONLY
Chlamydia: NEGATIVE
Comment: NEGATIVE
Comment: NEGATIVE
Comment: NORMAL
Neisseria Gonorrhea: NEGATIVE
Trichomonas: NEGATIVE

## 2021-12-31 IMAGING — CT CT ANGIO HEAD
2 of 6 series · 10 of 33 positions shown · IV contrast (omnipaque)
Comparison: None.

CLINICAL DATA: Initial evaluation for acute trauma, attempted
hanging.

EXAM:
CT ANGIOGRAPHY HEAD AND NECK
TECHNIQUE: Multidetector CT imaging of the head and neck was performed using
the standard protocol during bolus administration of intravenous
contrast. Multiplanar CT image reconstructions and MIPs were
obtained to evaluate the vascular anatomy. Carotid stenosis
measurements (when applicable) are obtained utilizing NASCET
criteria, using the distal internal carotid diameter as the
denominator.
CONTRAST:  100mL OMNIPAQUE IOHEXOL 350 MG/ML SOLN

[Series 3: head wo · axial · 0.43mm/px · z∈[-50,+6]mm · 2 of 34 slices shown]
[im 12/34  soft-tissue]
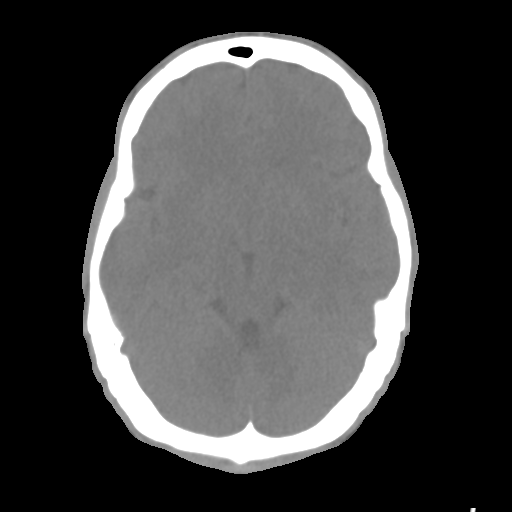
[im 23/34  soft-tissue]
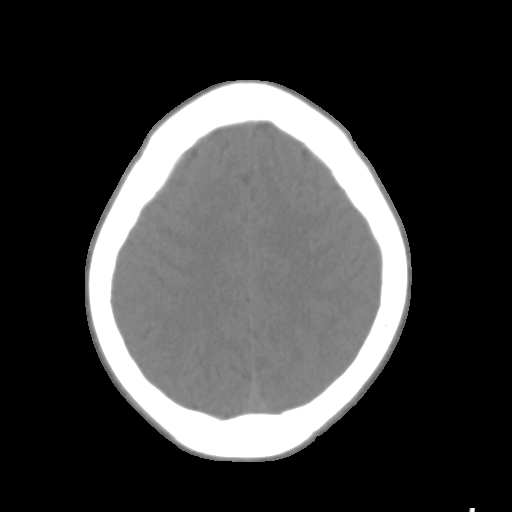

[Series 7: axial thick · axial · 0.43mm/px · z∈[-275,+15]mm · 8 of 76 slices shown]
[im 9/76  soft-tissue]
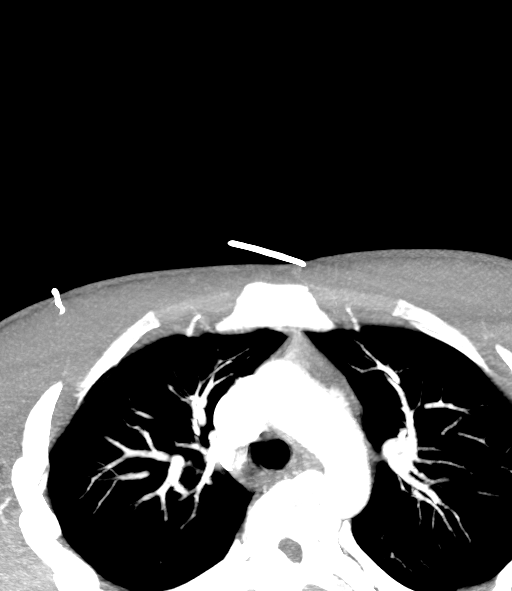
[im 17/76  bone]
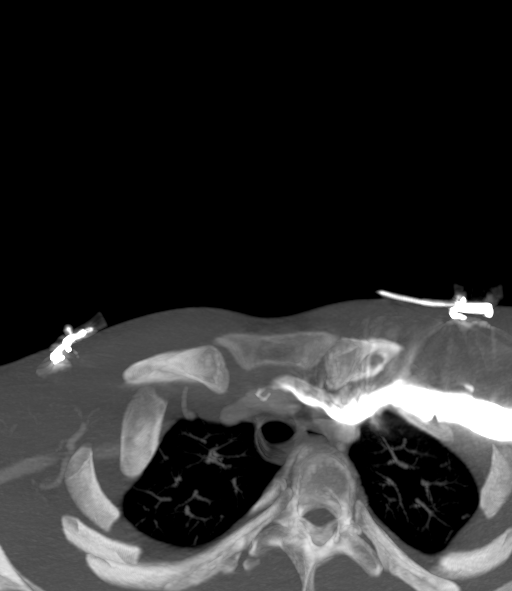
[im 26/76  soft-tissue]
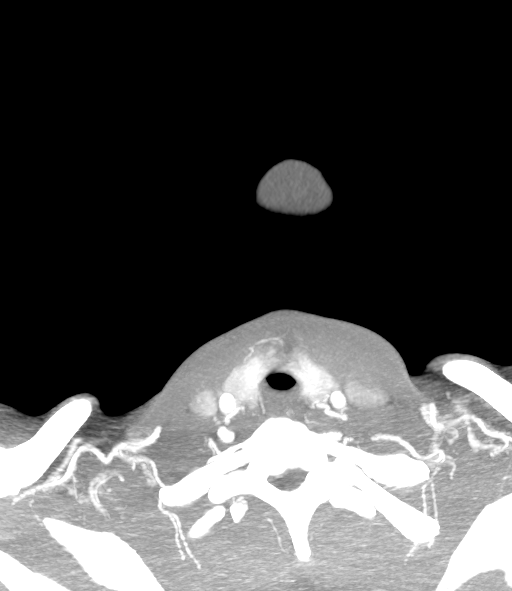
[im 34/76  bone]
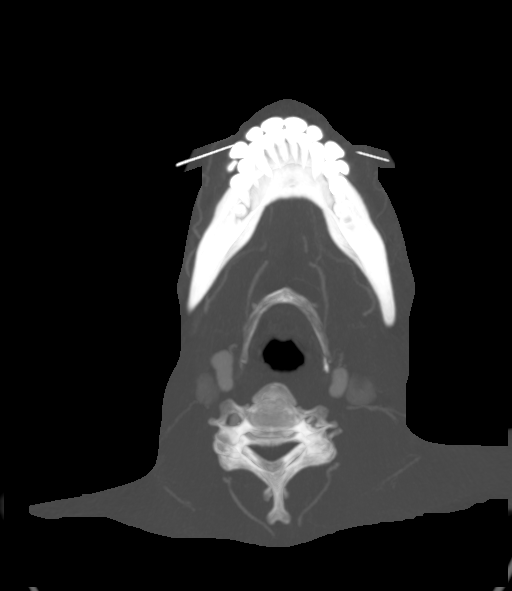
[im 42/76  soft-tissue]
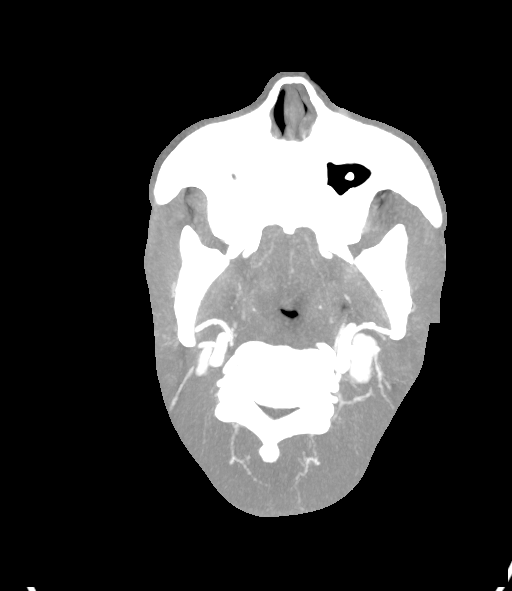
[im 51/76  bone]
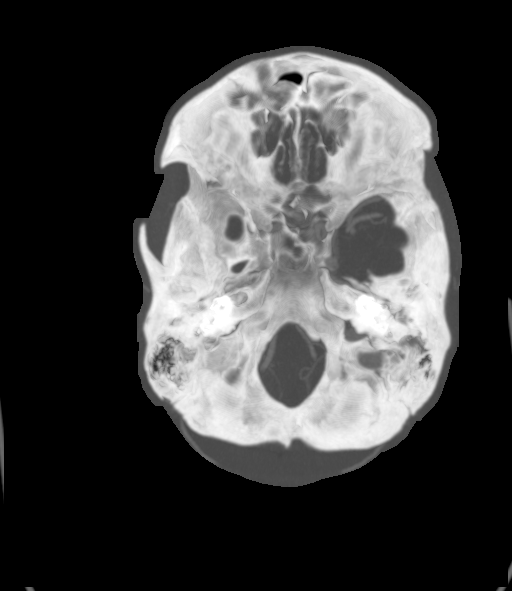
[im 59/76  soft-tissue]
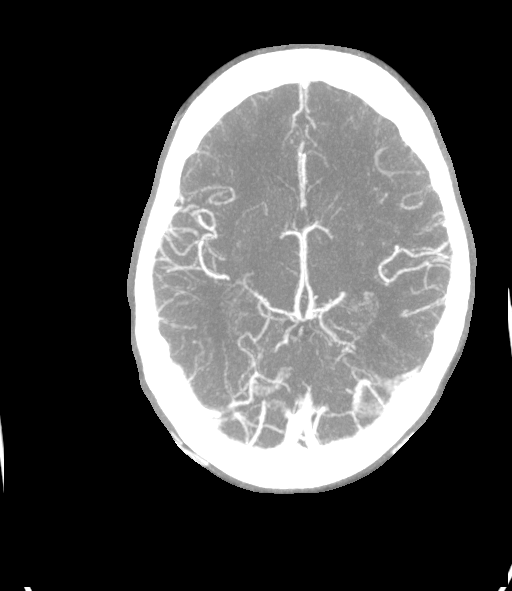
[im 67/76  bone]
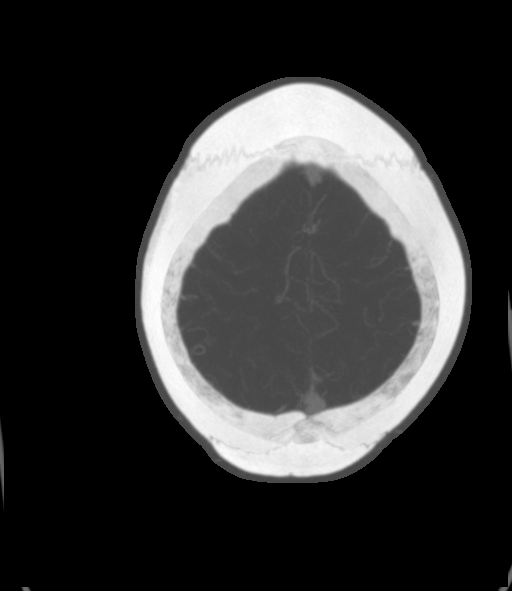

[10 of 33 positions shown; findings below may reference images not displayed]

FINDINGS: CT HEAD FINDINGS

Brain: Cerebral volume within normal limits for patient age.

No evidence for acute intracranial hemorrhage. No findings to
suggest acute large vessel territory infarct. No mass lesion,
midline shift, or mass effect. Ventricles are normal in size without
evidence for hydrocephalus. No extra-axial fluid collection
identified.

Vascular: No hyperdense vessel identified.

Skull: Scalp soft tissues demonstrate no acute abnormality.
Calvarium intact.

Sinuses/Orbits: Globes and orbital soft tissues within normal
limits.

Visualized paranasal sinuses are clear. No mastoid effusion.

CTA NECK FINDINGS

Aortic arch: Visualized aortic arch normal in caliber with normal
branch pattern. No stenosis or other abnormality about the origin of
the great vessels.

Right carotid system: Right common and internal carotid arteries
widely patent without stenosis, dissection or occlusion.

Left carotid system: Left common and internal carotid arteries
widely patent without stenosis, dissection or occlusion.

Vertebral arteries: Left vertebral artery arises directly from the
aortic arch. Right vertebral artery dominant. Vertebral arteries
widely patent without stenosis, dissection or occlusion.

Skeleton: No visible acute osseous finding. No discrete or worrisome
osseous lesions. Prominent thoracic scoliosis partially visualized.

Other neck: No other acute soft tissue abnormality within the neck.
No visible mass or adenopathy.

Upper chest: Unremarkable.

Review of the MIP images confirms the above findings

CTA HEAD FINDINGS

Anterior circulation: Both internal carotid arteries widely patent
to the termini without stenosis. A1 segments widely patent. Normal
anterior communicating artery complex. Both anterior cerebral
arteries widely patent to their distal aspects without stenosis. No
M1 stenosis or occlusion. Normal MCA bifurcations. Distal MCA
branches well perfused and symmetric.

Posterior circulation: Both V4 segments patent to the
vertebrobasilar junction without stenosis. Both PICA origins patent
and normal. Basilar widely patent to its distal aspect without
stenosis. Superior cerebellar arteries patent bilaterally. Both PCAs
primarily supplied via the basilar and are well perfused to there
distal aspects.

Venous sinuses: Grossly patent allowing for timing the contrast
bolus.

Anatomic variants: None significant.  No aneurysm.

Review of the MIP images confirms the above findings
IMPRESSION: 1. Normal CTA of the head and neck. No acute traumatic vascular
injury identified.
2. No other acute intracranial abnormality.

## 2022-01-10 ENCOUNTER — Emergency Department (HOSPITAL_COMMUNITY): Payer: Self-pay

## 2022-01-10 ENCOUNTER — Other Ambulatory Visit: Payer: Self-pay

## 2022-01-10 ENCOUNTER — Encounter (HOSPITAL_COMMUNITY): Payer: Self-pay | Admitting: Emergency Medicine

## 2022-01-10 ENCOUNTER — Emergency Department (HOSPITAL_COMMUNITY)
Admission: EM | Admit: 2022-01-10 | Discharge: 2022-01-11 | Disposition: A | Discharge status: Home / Self Care | Payer: Self-pay | Attending: Student | Admitting: Student

## 2022-01-10 DIAGNOSIS — Y990 Civilian activity done for income or pay: Secondary | ICD-10-CM | POA: Insufficient documentation

## 2022-01-10 DIAGNOSIS — S61032A Puncture wound without foreign body of left thumb without damage to nail, initial encounter: Secondary | ICD-10-CM | POA: Insufficient documentation

## 2022-01-10 DIAGNOSIS — Z23 Encounter for immunization: Secondary | ICD-10-CM | POA: Insufficient documentation

## 2022-01-10 DIAGNOSIS — T148XXA Other injury of unspecified body region, initial encounter: Secondary | ICD-10-CM

## 2022-01-10 DIAGNOSIS — W450XXA Nail entering through skin, initial encounter: Secondary | ICD-10-CM | POA: Insufficient documentation

## 2022-01-10 MED ORDER — TETANUS-DIPHTH-ACELL PERTUSSIS 5-2.5-18.5 LF-MCG/0.5 IM SUSY
0.5000 mL | PREFILLED_SYRINGE | Freq: Once | INTRAMUSCULAR | Status: AC
  Administered 2022-01-11: 0.5 mL via INTRAMUSCULAR
  Filled 2022-01-10: qty 0.5

## 2022-01-10 NOTE — ED Provider Triage Note (Signed)
Emergency Medicine Provider Triage Evaluation Note  Richard [name] , a 21 y.o. male  was evaluated in triage.  Pt complains of presents after an injury to his right thumb, states he works in a [employer] and was moving debris and a nail went to his right thumb, states he did not have any pain initially but as time went on he is having worsening pain worsened with movements, denies any paresthesias or weakness, he is not immunocompromise, does not remember the last time he had tetanus shot, no other complaints. Review of Systems  Positive: Left thumb pain, puncture wound Negative: Paresthesias, weakness  Physical Exam  BP 129/85 (BP Location: Right Arm)   Pulse 66   Temp 98.5 F (36.9 C) (Oral)   Resp 16   SpO2 100%  Gen:   Awake, no distress   Resp:  Normal effort  MSK:   Moves extremities without difficulty  Other:    Medical Decision Making  Medically screening exam initiated at 10:34 PM.  Appropriate orders placed.  Richard [name] was informed that the remainder of the evaluation will be completed by another provider, this initial triage assessment does not replace that evaluation, and the importance of remaining in the ED until their evaluation is complete.  Imaging been ordered will need further work-up.   Marcello Fennel, PA-C 01/10/22 2236

## 2022-01-10 NOTE — ED Triage Notes (Signed)
Pt reports puncture wound to right thumb, states a nail at work went through 2 pairs of gloves. Unknown last tetanus. C/o increased joint pain to same thumb.

## 2022-01-11 MED ORDER — CEPHALEXIN 500 MG PO CAPS: 500.0000 mg | ORAL_CAPSULE | Freq: Four times a day (QID) | ORAL | 0 refills | Status: AC

## 2022-01-11 NOTE — Discharge Instructions (Addendum)
You have a small puncture wound noted on your left thumb, imaging was reassuring, I have started you on an antibiotic please take as prescribed, please keep the thumb clean, recommend cleaning it out and apply new dressings do it twice daily.  You may use over-the-counter pain medications as needed.  If you note after 2 to 3 days of antibiotic use you having worsening pain worsening redness discharge redness moving up your hand you must come back in for reassessment.  May follow-up with your primary care doctor as needed

## 2022-01-11 NOTE — ED Provider Notes (Signed)
[facility] EMERGENCY DEPARTMENT Provider Note   CSN: [id number] Arrival date & time: 01/10/22  2206     History  Chief Complaint  Patient presents with   Hand Injury    Richard [name] is a 21 y.o. male.  HPI   Pt complains of presents after an injury to his left thumb, states he works in a [employer] and was moving debris and a nail went to his left thumb, states he did not have any pain initially but as time went on he is having worsening pain worsened with movements, denies any paresthesias or weakness, he is not immunocompromise, does not remember the last time he had tetanus shot, no other complaints.  Home Medications Prior to Admission medications   Medication Sig Start Date End Date Taking? Authorizing Provider  gabapentin (NEURONTIN) 100 MG capsule Take 1 capsule (100 mg total) by mouth 3 (three) times daily. For anxiety or nerve pain 07/17/19 11/05/19  Raylene Everts, MD      Allergies    Patient has no known allergies.    Review of Systems   Review of Systems  Constitutional:  Negative for chills and fever.  Respiratory:  Negative for shortness of breath.   Cardiovascular:  Negative for chest pain.  Gastrointestinal:  Negative for abdominal pain.  Skin:  Positive for wound.  Neurological:  Negative for headaches.    Physical Exam Updated Vital Signs BP 129/85 (BP Location: Right Arm)   Pulse 66   Temp 98.5 F (36.9 C) (Oral)   Resp 16   SpO2 100%  Physical Exam Vitals and nursing note reviewed.  Constitutional:      General: He is not in acute distress.    Appearance: He is not ill-appearing.  HENT:     Head: Normocephalic and atraumatic.     Nose: No congestion.  Eyes:     Conjunctiva/sclera: Conjunctivae normal.  Cardiovascular:     Rate and Rhythm: Normal rate and regular rhythm.     Pulses: Normal pulses.  Pulmonary:     Effort: Pulmonary effort is normal.  Musculoskeletal:     Comments: Patient has a small puncture  wound noted on the palmar distal aspect of the left thumb, hemodynamically stable, there is no foreign body present during my examination, area was tender to palpation, he has full range of motion all joints, compartments soft, sensation tact light touch, 2 send capillary refill.  Skin:    General: Skin is warm and dry.  Neurological:     Mental Status: He is alert.  Psychiatric:        Mood and Affect: Mood normal.     ED Results / Procedures / Treatments   Labs (all labs ordered are listed, but only abnormal results are displayed) Labs Reviewed - No data to display  EKG None  Radiology DG Finger Thumb Left  Result Date: 01/10/2022 CLINICAL DATA:  Puncture wound to the left thumb, initial encounter EXAM: LEFT THUMB 2+V COMPARISON:  None Available. FINDINGS: No acute fracture or dislocation is noted. Questionable radiopaque foreign body is noted in the soft tissues between the first and second metacarpal seen only on one view. IMPRESSION: No fracture noted. Questionable radiopaque foreign body between the first and second metacarpal. Correlate with the wound location. Electronically Signed   By: Inez Catalina M.D.   On: 01/10/2022 22:55    Procedures Procedures    Medications Ordered in ED Medications  Tdap (BOOSTRIX) injection 0.5 mL (has no  administration in time range)    ED Course/ Medical Decision Making/ A&P                           Medical Decision Making Amount and/or Complexity of Data Reviewed Radiology: ordered.  Risk Prescription drug management.   This patient presents to the ED for concern of injury to the left thumb, this involves an extensive number of treatment options, and is a complaint that carries with it a high risk of complications and morbidity.  The differential diagnosis includes fracture, dislocation, foreign body    Additional history obtained:  Additional history obtained from N/A External records from outside source obtained and  reviewed including immunization records   Co morbidities that complicate the patient evaluation  N/A  Social Determinants of Health:  N/A    Lab Tests:  I Ordered, and personally interpreted labs.  The pertinent results include: N/A   Imaging Studies ordered:  I ordered imaging studies including x-ray of the left hand I independently visualized and interpreted imaging which showed negative acute findings I agree with the radiologist interpretation   Cardiac Monitoring:  The patient was maintained on a cardiac monitor.  I personally viewed and interpreted the cardiac monitored which showed an underlying rhythm of: N/A   Medicines ordered and prescription drug management:  I ordered medication including Tdap I have reviewed the patients home medicines and have made adjustments as needed  Critical Interventions:  N/A   Reevaluation:  Presents with a puncture wound, hemodynamically stable, will obtain imaging for further evaluation  Reveals possible small foreign body in the hand, I doubt this is related to the injury as the injury is noted on the distal palmar aspect of the thumb there is no injury around where this foreign body was noted on imaging suspect this is an old foreign body.  Patient is agreement with plan discharge at this time.   Consultations Obtained:  N/a    Test Considered:  N/a    Rule out  Low suspicion for fracture or dislocation as x-ray does not feel any significant findings. low suspicion for ligament or tendon damage as area was palpated no gross defects noted, they had full range of motion as well as 5/5 strength.  Low suspicion for compartment syndrome as area was palpated it was soft to the touch, neurovascular fully intact.     Dispostion and problem list  After consideration of the diagnostic results and the patients response to treatment, I feel that the patent would benefit from discharge.  Puncture wound-started on  antibiotics, Tdap was updated, follow-up with PCP as needed strict return precautions.            Final Clinical Impression(s) / ED Diagnoses Final diagnoses:  None    Rx / DC Orders ED Discharge Orders     None         Marcello Fennel, PA-C 01/11/22 0030    Ripley Fraise, MD 01/11/22 [phone]

## 2022-05-11 ENCOUNTER — Ambulatory Visit (HOSPITAL_COMMUNITY)
Admission: EM | Admit: 2022-05-11 | Discharge: 2022-05-11 | Disposition: A | Discharge status: Home / Self Care | Payer: Self-pay | Attending: Physician Assistant | Admitting: Physician Assistant

## 2022-05-11 ENCOUNTER — Encounter (HOSPITAL_COMMUNITY): Payer: Self-pay | Admitting: *Deleted

## 2022-05-11 DIAGNOSIS — Z202 Contact with and (suspected) exposure to infections with a predominantly sexual mode of transmission: Secondary | ICD-10-CM | POA: Insufficient documentation

## 2022-05-11 DIAGNOSIS — R110 Nausea: Secondary | ICD-10-CM | POA: Insufficient documentation

## 2022-05-11 DIAGNOSIS — R0789 Other chest pain: Secondary | ICD-10-CM | POA: Insufficient documentation

## 2022-05-11 LAB — HIV ANTIBODY (ROUTINE TESTING W REFLEX): HIV Screen 4th Generation wRfx: NONREACTIVE

## 2022-05-11 LAB — COMPREHENSIVE METABOLIC PANEL
ALT: 29 U/L (ref 0–44)
AST: 30 U/L (ref 15–41)
Albumin: 3.3 g/dL — ABNORMAL LOW (ref 3.5–5.0)
Alkaline Phosphatase: 67 U/L (ref 38–126)
Anion gap: 10 (ref 5–15)
BUN: 8 mg/dL (ref 6–20)
CO2: 22 mmol/L (ref 22–32)
Calcium: 8.7 mg/dL — ABNORMAL LOW (ref 8.9–10.3)
Chloride: 106 mmol/L (ref 98–111)
Creatinine, Ser: 0.93 mg/dL (ref 0.61–1.24)
GFR, Estimated: >60 mL/min (ref >60)
Glucose, Bld: 87 mg/dL (ref 70–99)
Potassium: 3.7 mmol/L (ref 3.5–5.1)
Sodium: 138 mmol/L (ref 135–145)
Total Bilirubin: 0.5 mg/dL (ref 0.3–1.2)
Total Protein: 4.8 g/dL — ABNORMAL LOW (ref 6.5–8.1)

## 2022-05-11 LAB — HEPATITIS C ANTIBODY: HCV Ab: NONREACTIVE

## 2022-05-11 MED ORDER — ONDANSETRON 4 MG PO TBDP: 4.0000 mg | ORAL_TABLET | Freq: Three times a day (TID) | ORAL | 0 refills | Status: DC | PRN

## 2022-05-11 MED ORDER — RALTEGRAVIR POTASSIUM 400 MG PO TABS: 400.0000 mg | ORAL_TABLET | Freq: Two times a day (BID) | ORAL | 0 refills | Status: DC

## 2022-05-11 MED ORDER — EMTRICITABINE-TENOFOVIR DF 200-300 MG PO TABS: 1.0000 | ORAL_TABLET | Freq: Every day | ORAL | 0 refills | Status: DC

## 2022-05-11 NOTE — ED Provider Notes (Signed)
[place]    CSN: [id number] Arrival date & time: 05/11/22  1815      History   Chief Complaint Chief Complaint  Patient presents with   SEXUALLY TRANSMITTED DISEASE    HPI Richard [name] is a 22 y.o. male.   Patient presents today with multiple concerns.  His primary concern today is that he was having sexual intercourse with a woman yesterday when the condom broke and he is concerned that he might have been exposed to something.  He does have a history of STI in the past but has completed course of treatment.  He is interested in starting postexposure prophylaxis for HIV as he is unsure if he could have been exposed.  He is also requesting complete STI panel.  In addition, patient reports that earlier this week he had an episode of sharp migratory chest pain.  He denies any associated diaphoresis, nausea, vomiting, shortness of breath.  Episode lasted for approximately 20 minutes and then resolved without intervention.  It did occur while he was at work where he is exposed to a significant amount of dust.  He also reports smoking and wonders if this could be contributing to symptoms.  He has not had any recurrent episodes.  He did not take any medication to help manage symptoms.  Reports he is currently asymptomatic.  In addition, patient reports a several month history of intermittent nausea.  Reports that approximately a month ago he had a upper respiratory infection that included nausea vomiting diarrhea.  The symptoms have since resolved but he continues to have intermittent nausea.  This is worse at night and he has not identified any specific food triggers.  He has not tried any medication to help manage symptoms.  He does have a prolonged history of constipation since childhood and reports that this is adequately managed with diet.  Reports he is having 1 bowel movement every few days.  He denies any abdominal pain.  Denies history of GERD, gastroparesis.    Past Medical  History:  Diagnosis Date   Chlamydia    Scoliosis    Seizure ([place]) 03/20/2019    Patient Active Problem List   Diagnosis Date Noted   Suicide attempt by hanging ([place]) 09/07/2020   MDD (major depressive disorder), severe ([place]) 09/07/2020   MDD (major depressive disorder), single episode, severe , no psychosis ([place]) 09/07/2020   Seizure-like activity ([place]) 03/21/2019   Seizure ([place]) 03/20/2019    History reviewed. No pertinent surgical history.     Home Medications    Prior to Admission medications   Medication Sig Start Date End Date Taking? Authorizing Provider  emtricitabine-tenofovir (TRUVADA) 200-300 MG tablet Take 1 tablet by mouth daily. 05/11/22  Yes Sherah Lund K, PA-C  ondansetron (ZOFRAN-ODT) 4 MG disintegrating tablet Take 1 tablet (4 mg total) by mouth every 8 (eight) hours as needed for nausea or vomiting. 05/11/22  Yes Sigourney Portillo K, PA-C  raltegravir (ISENTRESS) 400 MG tablet Take 1 tablet (400 mg total) by mouth 2 (two) times daily. 05/11/22  Yes Shanara Schnieders K, PA-C  gabapentin (NEURONTIN) 100 MG capsule Take 1 capsule (100 mg total) by mouth 3 (three) times daily. For anxiety or nerve pain 07/17/19 11/05/19  Raylene Everts, MD    Family History Family History  Problem Relation Age of Onset   Sickle cell anemia Mother    CVA Mother        CASUSED BY SICKLE CELL    Seizures Mother  after cva   Healthy Father     Social History Social History   Tobacco Use   Smoking status: Some Days    Types: Cigars   Smokeless tobacco: Never   Tobacco comments:    2 cigs per day  Vaping Use   Vaping Use: Never used  Substance Use Topics   Alcohol use: Not Currently   Drug use: Yes    Types: Marijuana     Allergies   Patient has no known allergies.   Review of Systems Review of Systems  Constitutional:  Positive for activity change. Negative for appetite change, fatigue and fever.  Respiratory:  Negative for cough and shortness of breath.    Cardiovascular:  Negative for chest pain (resolved).  Gastrointestinal:  Negative for abdominal pain, diarrhea, nausea (intermittent) and vomiting.  Genitourinary:  Negative for dysuria, frequency and urgency.     Physical Exam Triage Vital Signs ED Triage Vitals  Enc Vitals Group     BP 05/11/22 1939 123/75     Pulse Rate 05/11/22 1939 66     Resp 05/11/22 1939 18     Temp 05/11/22 1939 98.5 F (36.9 C)     Temp Source 05/11/22 1939 Oral     SpO2 05/11/22 1939 98 %     Weight --      Height --      Head Circumference --      Peak Flow --      Pain Score 05/11/22 1938 0     Pain Loc --      Pain Edu? --      Excl. in [place]? --    No data found.  Updated Vital Signs BP 123/75 (BP Location: Right Arm)   Pulse 66   Temp 98.5 F (36.9 C) (Oral)   Resp 18   SpO2 98%   Visual Acuity Right Eye Distance:   Left Eye Distance:   Bilateral Distance:    Right Eye Near:   Left Eye Near:    Bilateral Near:     Physical Exam Vitals reviewed.  Constitutional:      General: He is awake.     Appearance: Normal appearance. He is well-developed. He is not ill-appearing.     Comments: Very pleasant male appears stated age in no acute distress sitting comfortably in exam room  HENT:     Head: Normocephalic and atraumatic.     Mouth/Throat:     Pharynx: Uvula midline. No oropharyngeal exudate or posterior oropharyngeal erythema.  Cardiovascular:     Rate and Rhythm: Normal rate and regular rhythm.     Heart sounds: Normal heart sounds, S1 normal and S2 normal. No murmur heard. Pulmonary:     Effort: Pulmonary effort is normal.     Breath sounds: Normal breath sounds. No stridor. No wheezing, rhonchi or rales.     Comments: Clear to auscultation bilaterally Chest:     Chest wall: No deformity, swelling or tenderness.  Abdominal:     General: Bowel sounds are normal.     Palpations: Abdomen is soft.     Tenderness: There is no abdominal tenderness. There is no right CVA  tenderness, left CVA tenderness, guarding or rebound.     Comments: Benign abdominal exam  Neurological:     Mental Status: He is alert.  Psychiatric:        Behavior: Behavior is cooperative.      UC Treatments / Results  Labs (all labs ordered are listed,  but only abnormal results are displayed) Labs Reviewed  COMPREHENSIVE METABOLIC PANEL  RPR  HIV ANTIBODY (ROUTINE TESTING W REFLEX)  HEPATITIS C ANTIBODY  CYTOLOGY, (ORAL, ANAL, URETHRAL) ANCILLARY ONLY    EKG   Radiology No results found.  Procedures Procedures (including critical care time)  Medications Ordered in UC Medications - No data to display  Initial Impression / Assessment and Plan / UC Course  I have reviewed the triage vital signs and the nursing notes.  Pertinent labs & imaging results that were available during my care of the patient were reviewed by me and considered in my medical decision making (see chart for details).     STI swab collected today.  Testing for HIV, hepatitis, syphilis obtained today.  Discussed that given description of sexual encounter it is low likelihood that he would be exposed to HIV, however, patient is very interested in starting postexposure prophylaxis.  Truvada and Isentress started for 28 days.  Discussed that he will need to be repeat tested in 4 weeks as well as again at 3 and 6 months.  If he develops any symptoms he is to be seen immediately to which he expressed understanding.  Patient is well-appearing, afebrile, nontoxic, nontachycardic.  Unclear etiology of chest discomfort but it is since resolved and he has been asymptomatic for a few days.  Discussed that it is possible this is related to exposure status post employment as he does report being exposed to a lot of dust.  Encouraged him to use N95 mask when he is at work and decrease how much he is smoking.  Discussed that if he has any recurrent symptoms he should return immediately for further evaluation and  management including potentially chest x-ray.  This was deferred today given he is asymptomatic and feeling well.  Patient is well-appearing, afebrile, nontoxic, nontachycardic.  Vital signs physical exam are reassuring today; indication for emergent evaluation or imaging.  He was provided Zofran to help manage intermittent nausea symptoms.  Recommended that he increase the fiber and water in his diet to encourage having a bowel movement at least once daily as this could be contributing to his symptoms.  He was encouraged to avoid spicy/acidic/fatty foods.  If he has any worsening or changing symptoms including recurrent vomiting, persistent nausea, changes in his bowel habits, abdominal pain he should be reevaluated.  Strict return precautions given.  Final Clinical Impressions(s) / UC Diagnoses   Final diagnoses:  Possible exposure to STD  Chest discomfort  Nausea without vomiting     Discharge Instructions      Monitor your MyChart for your results.  We are going to start postexposure prophylaxis for HIV.  Take Truvada daily for 28 days.  Take raltegravir twice daily for 20 days.  As we discussed, you will need to have repeat testing in 4 weeks and again at 3 and 6 months.  If he develop any symptoms please return for reevaluation.  I am glad that you are not having chest pain at this time.  I recommend you use an N95 mask at work.  Continue decreasing, to your smoking.  If you have any recurrent symptoms please return for reevaluation.  I have called in Zofran to help with your nausea symptoms.  I recommend you eat a bland diet and drink plenty of fluid.  It is possible that constipation is contributing to your intermittent nausea and I recommend that you increase the fiber in your diet and drink plenty of fluid in  order to achieve 1 bowel movement per day.  If you have any severe abdominal pain, difficulty passing stool, nausea/vomiting interfering with oral intake you should be  reevaluated.     ED Prescriptions     Medication Sig Dispense Auth. Provider   ondansetron (ZOFRAN-ODT) 4 MG disintegrating tablet Take 1 tablet (4 mg total) by mouth every 8 (eight) hours as needed for nausea or vomiting. 20 tablet Aissata Wilmore K, PA-C   emtricitabine-tenofovir (TRUVADA) 200-300 MG tablet Take 1 tablet by mouth daily. 28 tablet Swayze Pries K, PA-C   raltegravir (ISENTRESS) 400 MG tablet Take 1 tablet (400 mg total) by mouth 2 (two) times daily. 56 tablet Daxter Paule, Derry Skill, PA-C      PDMP not reviewed this encounter.   Terrilee Croak, Hershal Coria 05/11/22 2109

## 2022-05-11 NOTE — ED Triage Notes (Signed)
Pt states last night he was having sex and his protection broke and he would like tested and treated just in case he is positive for anything.   He complains of nausea without vomiting x 2 weeks. The nausea and fatigue is on and off he is worried it is his work environment. He states Wednesday he had some pains in his chest but he is a smoker they have resolved and he hasn't had them since.

## 2022-05-11 NOTE — Discharge Instructions (Signed)
Monitor your MyChart for your results.  We are going to start postexposure prophylaxis for HIV.  Take Truvada daily for 28 days.  Take raltegravir twice daily for 20 days.  As we discussed, you will need to have repeat testing in 4 weeks and again at 3 and 6 months.  If he develop any symptoms please return for reevaluation.  I am glad that you are not having chest pain at this time.  I recommend you use an N95 mask at work.  Continue decreasing, to your smoking.  If you have any recurrent symptoms please return for reevaluation.  I have called in Zofran to help with your nausea symptoms.  I recommend you eat a bland diet and drink plenty of fluid.  It is possible that constipation is contributing to your intermittent nausea and I recommend that you increase the fiber in your diet and drink plenty of fluid in order to achieve 1 bowel movement per day.  If you have any severe abdominal pain, difficulty passing stool, nausea/vomiting interfering with oral intake you should be reevaluated.

## 2022-05-12 LAB — RPR: RPR Ser Ql: NONREACTIVE

## 2022-05-14 ENCOUNTER — Telehealth (HOSPITAL_COMMUNITY): Payer: Self-pay | Admitting: Emergency Medicine

## 2022-05-14 LAB — CYTOLOGY, (ORAL, ANAL, URETHRAL) ANCILLARY ONLY
Chlamydia: NEGATIVE
Comment: NEGATIVE
Comment: NEGATIVE
Comment: NORMAL
Neisseria Gonorrhea: NEGATIVE
Trichomonas: NEGATIVE

## 2022-05-14 MED ORDER — EMTRICITABINE-TENOFOVIR DF 200-300 MG PO TABS: 1.0000 | ORAL_TABLET | Freq: Every day | ORAL | 0 refills | Status: DC

## 2022-05-14 NOTE — Telephone Encounter (Signed)
Pharmacy states Truvada must be in 30 day quantities.  Reviewed with Junie Panning, okay'd to resend for 30 tablets

## 2022-08-30 ENCOUNTER — Other Ambulatory Visit: Payer: Self-pay

## 2022-08-30 ENCOUNTER — Ambulatory Visit (HOSPITAL_COMMUNITY)
Admission: EM | Admit: 2022-08-30 | Discharge: 2022-08-30 | Disposition: A | Discharge status: Home / Self Care | Payer: Self-pay | Attending: Internal Medicine | Admitting: Internal Medicine

## 2022-08-30 ENCOUNTER — Encounter (HOSPITAL_COMMUNITY): Payer: Self-pay | Admitting: Emergency Medicine

## 2022-08-30 DIAGNOSIS — R0789 Other chest pain: Secondary | ICD-10-CM

## 2022-08-30 DIAGNOSIS — R002 Palpitations: Secondary | ICD-10-CM

## 2022-08-30 DIAGNOSIS — R5383 Other fatigue: Secondary | ICD-10-CM

## 2022-08-30 NOTE — Discharge Instructions (Signed)
Recommend continue to monitor your symptoms. If chest pain increases or you continue to have more palpitations, go to the ER ASAP. Continue to avoid caffeine and smoking. Recommend schedule an appointment with [facility] (Cardiologist) for further evaluation if symptoms continue.

## 2022-08-30 NOTE — ED Provider Notes (Signed)
[facility]    CSN: [id number] Arrival date & time: 08/30/22  4098      History   Chief Complaint Chief Complaint  Patient presents with   Tachycardia   Shortness of Breath    HPI Richard [name] is a 22 y.o. male.   22 year old male presents with continued concern over intermittent palpitations and left sided chest pain that has been occurring for over 2 weeks. Started with flu or COVID symptoms about 2 weeks ago. Started to feel sick at work with nausea and vomiting then developed nasal congestion, body aches, and chest congestion and fatigue. Denies any distinct fever. No abdominal pain, vomiting or diarrhea now.  Congestion and cough has cleared up but he continues to have intermittent left-sided chest pains.  Occasionally feels like little needles poking in his chest.  Last episode occurred last evening.  He also noticed some left arm pain and now some left leg pain.  Still feels weak with occasional nausea and no appetite.  No previous history of similar symptoms.  Does consume energy drinks on occasion but has not had one in the past 2 weeks.  Occasionally smokes tobacco.  No alcohol use or recent illicit drug use.  No family history of cardiac issues.  No other current chronic health issues.  Takes no daily medication.  The history is provided by the patient.    Past Medical History:  Diagnosis Date   Chlamydia    Scoliosis    Seizure ([facility]) 03/20/2019    Patient Active Problem List   Diagnosis Date Noted   Suicide attempt by hanging ([facility]) 09/07/2020   MDD (major depressive disorder), severe ([facility]) 09/07/2020   MDD (major depressive disorder), single episode, severe , no psychosis ([facility]) 09/07/2020   Seizure-like activity ([facility]) 03/21/2019   Seizure ([facility]) 03/20/2019    History reviewed. No pertinent surgical history.     Home Medications    Prior to Admission medications   Medication Sig Start Date End Date Taking? Authorizing Provider  gabapentin (NEURONTIN)  100 MG capsule Take 1 capsule (100 mg total) by mouth 3 (three) times daily. For anxiety or nerve pain 07/17/19 11/05/19  Eustace Moore, MD    Family History Family History  Problem Relation Age of Onset   Sickle cell anemia Mother    CVA Mother        CASUSED BY SICKLE CELL    Seizures Mother        after cva   Healthy Father     Social History Social History   Tobacco Use   Smoking status: Some Days    Types: Cigars   Smokeless tobacco: Never   Tobacco comments:    2 cigs per day  Vaping Use   Vaping Use: Never used  Substance Use Topics   Alcohol use: Not Currently   Drug use: Yes    Types: Marijuana     Allergies   Patient has no known allergies.   Review of Systems Review of Systems  Constitutional:  Positive for appetite change and fatigue. Negative for activity change, chills, diaphoresis and fever.  HENT:  Positive for congestion (at first but has resolved). Negative for ear discharge, ear pain, facial swelling, mouth sores, postnasal drip, rhinorrhea, sinus pressure, sinus pain, sore throat and trouble swallowing.   Eyes:  Negative for photophobia and visual disturbance.  Respiratory:  Positive for shortness of breath (intermittent). Negative for cough, chest tightness and wheezing.   Cardiovascular:  Positive  for chest pain (intermittent) and palpitations. Negative for leg swelling.  Gastrointestinal:  Positive for nausea and vomiting (has resolved). Negative for abdominal pain and diarrhea.  Musculoskeletal:  Positive for myalgias. Negative for back pain, neck pain and neck stiffness.  Skin:  Negative for color change and rash.  Allergic/Immunologic: Negative for environmental allergies, food allergies and immunocompromised state.  Neurological:  Positive for weakness (but has improved). Negative for dizziness, tremors, seizures (no recent), syncope, speech difficulty, light-headedness, numbness and headaches.  Hematological:  Negative for adenopathy.  Does not bruise/bleed easily.     Physical Exam Triage Vital Signs ED Triage Vitals  Enc Vitals Group     BP 08/30/22 0837 107/71     Pulse Rate 08/30/22 0837 74     Resp 08/30/22 0837 16     Temp 08/30/22 0837 98.2 F (36.8 C)     Temp Source 08/30/22 0837 Oral     SpO2 08/30/22 0837 98 %     Weight --      Height --      Head Circumference --      Peak Flow --      Pain Score 08/30/22 0836 0     Pain Loc --      Pain Edu? --      Excl. in [place]? --    No data found.  Updated Vital Signs BP 107/71 (BP Location: Right Arm)   Pulse 74   Temp 98.2 F (36.8 C) (Oral)   Resp 16   SpO2 98%   Visual Acuity Right Eye Distance:   Left Eye Distance:   Bilateral Distance:    Right Eye Near:   Left Eye Near:    Bilateral Near:     Physical Exam Vitals and nursing note reviewed.  Constitutional:      General: He is awake. He is not in acute distress.    Appearance: He is well-developed and well-groomed. He is not ill-appearing.     Comments: He is sitting on the exam table in no acute distress, talking in complete sentences, with no increased work of breathing and not currently in pain.   HENT:     Head: Normocephalic and atraumatic.     Right Ear: Hearing, tympanic membrane, ear canal and external ear normal.     Left Ear: Hearing, tympanic membrane, ear canal and external ear normal.     Nose: Nose normal.     Mouth/Throat:     Lips: Pink.     Mouth: Mucous membranes are moist.     Pharynx: Oropharynx is clear. Uvula midline. No pharyngeal swelling, oropharyngeal exudate, posterior oropharyngeal erythema or uvula swelling.  Eyes:     Extraocular Movements: Extraocular movements intact.     Conjunctiva/sclera: Conjunctivae normal.     Pupils: Pupils are equal, round, and reactive to light.  Cardiovascular:     Rate and Rhythm: Normal rate and regular rhythm.     Pulses: Normal pulses.     Heart sounds: Normal heart sounds. No murmur heard. Pulmonary:     Effort:  Pulmonary effort is normal. No tachypnea or respiratory distress.     Breath sounds: Normal breath sounds and air entry. No decreased air movement. No decreased breath sounds, wheezing, rhonchi or rales.  Musculoskeletal:        General: Normal range of motion.     Cervical back: Normal range of motion and neck supple.  Lymphadenopathy:     Cervical: No cervical adenopathy.  Skin:  General: Skin is warm and dry.     Capillary Refill: Capillary refill takes less than 2 seconds.     Findings: No rash.  Neurological:     General: No focal deficit present.     Mental Status: He is alert and oriented to person, place, and time.  Psychiatric:        Attention and Perception: Attention normal.        Mood and Affect: Mood is anxious.        Behavior: Behavior normal. Behavior is cooperative.        Thought Content: Thought content normal.        Judgment: Judgment normal.      UC Treatments / Results  Labs (all labs ordered are listed, but only abnormal results are displayed) Labs Reviewed - No data to display  EKG   Radiology No results found.  Procedures ED EKG  Date/Time: 08/30/2022 9:51 AM  Performed by: Sudie Grumbling, NP Authorized by: Carlisle Beers, FNP   ECG interpreted by ED Physician in the absence of a cardiologist: no Rennis Chris, NP also reviewed ECG)   Previous ECG:    Previous ECG:  Compared to current   Similarity:  No change   Comparison ECG info:  No distinct changes noted compared to ECG from 2022 Interpretation:    Interpretation: non-specific   Quality:    Tracing quality:  Limited by artifact Rate:    ECG rate:  61   ECG rate assessment: normal   Rhythm:    Rhythm: sinus rhythm   Ectopy:    Ectopy: none   QRS:    QRS axis:  Normal Comments:     Some artifact on tracing. No distinct abnormalities detected. Generally normal sinus rhythm. No significant changes from previous ECG.    (including critical care  time)  Medications Ordered in UC Medications - No data to display  Initial Impression / Assessment and Plan / UC Course  I have reviewed the triage vital signs and the nursing notes.  Pertinent labs & imaging results that were available during my care of the patient were reviewed by me and considered in my medical decision making (see chart for details).     Reviewed with patient no distinct abnormal findings on ECG. Discussed performing chest x-ray but patient declined. Reviewed that recent research has found some intermittent chest pain and cardiopulmonary symptoms after COVID infection and after Influenza which usually slowly resolve over time. However, he may need to see a Cardiologist for further evaluation. Did not perform any lab work at this time since symptoms have been occurring for over 1 week and currently not having symptoms now. Vitals are stable and no concerning or emergent clinical findings ar this time. Discussed that caffeine and tobacco use can increase palpitations and can cause chest discomfort. Encourage to limit or avoid if possible. If symptoms worsen with increased chest pain and shortness of breath or dizziness, go to the ER ASAP. Patient requests information regarding Cardiologist. Provided info for [facility]. Recommend follow-up with a Cardiologist if symptoms persist.  Final Clinical Impressions(s) / UC Diagnoses   Final diagnoses:  Palpitations  Intermittent left-sided chest pain  Fatigue, unspecified type     Discharge Instructions      Recommend continue to monitor your symptoms. If chest pain increases or you continue to have more palpitations, go to the ER ASAP. Continue to avoid caffeine and smoking. Recommend schedule an appointment with [facility] (Cardiologist) for further evaluation if symptoms continue.     ED Prescriptions   None    PDMP not reviewed this encounter.   Sudie Grumbling, NP 08/31/22 1034

## 2022-08-30 NOTE — ED Triage Notes (Signed)
Patient presents to [facility] for evaluation after having "flu-like" symptoms 2 weeks ago.  States he then began to have sensation that his heart was beating fast, he would have intermittent chest pain and shortness of breath, especially at work.  Patient denies continued cough from flu.  Patient denies chest pain at this time.  Patient also c/o left arm and leg pain intermittently.

## 2022-11-27 ENCOUNTER — Emergency Department (HOSPITAL_COMMUNITY): Payer: Self-pay

## 2022-11-27 ENCOUNTER — Other Ambulatory Visit: Payer: Self-pay

## 2022-11-27 ENCOUNTER — Encounter (HOSPITAL_COMMUNITY): Payer: Self-pay

## 2022-11-27 ENCOUNTER — Emergency Department (HOSPITAL_COMMUNITY)
Admission: EM | Admit: 2022-11-27 | Discharge: 2022-11-28 | Disposition: A | Discharge status: Home / Self Care | Payer: Self-pay | Attending: Emergency Medicine | Admitting: Emergency Medicine

## 2022-11-27 DIAGNOSIS — Z79899 Other long term (current) drug therapy: Secondary | ICD-10-CM | POA: Insufficient documentation

## 2022-11-27 DIAGNOSIS — R0602 Shortness of breath: Secondary | ICD-10-CM | POA: Insufficient documentation

## 2022-11-27 DIAGNOSIS — R072 Precordial pain: Secondary | ICD-10-CM | POA: Insufficient documentation

## 2022-11-27 NOTE — ED Triage Notes (Signed)
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 Pt was then hyperventilating and very anxious  98% on non rebreather with ems  142/80 HR 110

## 2022-11-27 NOTE — ED Provider Notes (Signed)
[place] [facility]GENC[facility] DEPARTMENT AT [facility] Provider Note   CSN: [id number] Arrival date & time: 11/27/22  2235     History {Add pertinent medical, surgical, social history, OB history to HPI:1} Chief Complaint  Patient presents with   Panic Attack    Richard [name] is a 22 y.o. male.  HPI   Patient with medical history including seizures, depression, presenting with concerns of left-sided chest pain shortness of breath.  Patient states that while he was sitting on the bus he suddenly had pain in his left arm and then rating down his left leg, he states he then felt pain in the middle of his chest, states he felt short of breath, states he tried to rub it out without much relief.  He had no associated headaches change in vision paresthesias or weakness the upper lower extremities, he has no cardiac history, no history PEs or DVTs currently no recent surgeries or long immobilizations.  Home Medications Prior to Admission medications   Medication Sig Start Date End Date Taking? Authorizing Provider  gabapentin (NEURONTIN) 100 MG capsule Take 1 capsule (100 mg total) by mouth 3 (three) times daily. For anxiety or nerve pain 07/17/19 11/05/19  Eustace Moore, MD      Allergies    Patient has no known allergies.    Review of Systems   Review of Systems  Constitutional:  Negative for chills and fever.  Respiratory:  Positive for chest tightness and shortness of breath.   Cardiovascular:  Negative for chest pain.  Gastrointestinal:  Negative for abdominal pain.  Neurological:  Negative for headaches.    Physical Exam Updated Vital Signs BP (!) 135/90 (BP Location: Right Arm)   Pulse 99   Temp 98.3 F (36.8 C) (Oral)   Resp (!) 28   SpO2 100%  Physical Exam Vitals and nursing note reviewed.  Constitutional:      General: He is not in acute distress.    Appearance: He is not ill-appearing.  HENT:     Head: Normocephalic and atraumatic.     Nose: No congestion.   Eyes:     Conjunctiva/sclera: Conjunctivae normal.  Cardiovascular:     Rate and Rhythm: Normal rate and regular rhythm.     Pulses: Normal pulses.     Heart sounds: No murmur heard.    No friction rub. No gallop.  Pulmonary:     Effort: No respiratory distress.     Breath sounds: No wheezing, rhonchi or rales.  Skin:    General: Skin is warm and dry.  Neurological:     Mental Status: He is alert.     Comments: Cranial nerves II through XII grossly intact no difficulty with word finding following his up command there is no unilateral weakness present.  Psychiatric:        Mood and Affect: Mood normal.     ED Results / Procedures / Treatments   Labs (all labs ordered are listed, but only abnormal results are displayed) Labs Reviewed  BASIC METABOLIC PANEL  CBC WITH DIFFERENTIAL/PLATELET  RAPID URINE DRUG SCREEN, HOSP PERFORMED  TROPONIN I (HIGH SENSITIVITY)    EKG None  Radiology No results found.  Procedures Procedures  {Document cardiac monitor, telemetry assessment procedure when appropriate:1}  Medications Ordered in ED Medications - No data to display  ED Course/ Medical Decision Making/ A&P   {   Click here for ABCD2, HEART and other calculatorsREFRESH Note before signing :1}  Medical Decision Making Amount and/or Complexity of Data Reviewed Labs: ordered. Radiology: ordered.   ***  {Document critical care time when appropriate:1} {Document review of labs and clinical decision tools ie heart score, Chads2Vasc2 etc:1}  {Document your independent review of radiology images, and any outside records:1} {Document your discussion with family members, caretakers, and with consultants:1} {Document social determinants of health affecting pt's care:1} {Document your decision making why or why not admission, treatments were needed:1} Final Clinical Impression(s) / ED Diagnoses Final diagnoses:  None    Rx / DC Orders ED  Discharge Orders     None

## 2022-11-27 NOTE — ED Provider Triage Note (Signed)
Emergency Medicine Provider Triage Evaluation Note  [name] , a 22 y.o. male  was evaluated in triage.  Pt complains of left-sided arm and leg pain, concerned that it might be his heart, having shortness of breath, no cardiac history no history PEs or DVTs no recent surgeries no long immobilizations..  Review of Systems  Positive: Left arm left leg pain Negative: Headaches change in vision  Physical Exam  BP (!) 135/90 (BP Location: Right Arm)   Pulse 99   Temp 98.3 F (36.8 C) (Oral)   Resp (!) 28   SpO2 100%  Gen:   Awake, no distress   Resp:  Normal effort  MSK:   Moves extremities without difficulty  Other:  Cranial nerves II through XII grossly intact no difficulty with word finding following two-step commands there is no unilateral weakness present my examination  Medical Decision Making  Medically screening exam initiated at 11:10 PM.  Appropriate orders placed.  Cary Brickhouse was informed that the remainder of the evaluation will be completed by another provider, this initial triage assessment does not replace that evaluation, and the importance of remaining in the ED until their evaluation is complete.  Lab workup and imaging have been ordered will need further workup.   Carroll Sage, PA-C 11/27/22 2311

## 2022-11-28 LAB — CBC WITH DIFFERENTIAL/PLATELET
Abs Immature Granulocytes: 0.03 10*3/uL (ref 0.00–0.07)
Basophils Absolute: 0.1 10*3/uL (ref 0.0–0.1)
Basophils Relative: 1 %
Eosinophils Absolute: 0.4 10*3/uL (ref 0.0–0.5)
Eosinophils Relative: 4 %
HCT: 35.8 % — ABNORMAL LOW (ref 39.0–52.0)
Hemoglobin: 12.7 g/dL — ABNORMAL LOW (ref 13.0–17.0)
Immature Granulocytes: 0 %
Lymphocytes Relative: 21 %
Lymphs Abs: 2.1 10*3/uL (ref 0.7–4.0)
MCH: 31.3 pg (ref 26.0–34.0)
MCHC: 35.5 g/dL (ref 30.0–36.0)
MCV: 88.2 fL (ref 80.0–100.0)
Monocytes Absolute: 0.9 10*3/uL (ref 0.1–1.0)
Monocytes Relative: 9 %
Neutro Abs: 6.3 10*3/uL (ref 1.7–7.7)
Neutrophils Relative %: 65 %
Platelets: 269 10*3/uL (ref 150–400)
RBC: 4.06 MIL/uL — ABNORMAL LOW (ref 4.22–5.81)
RDW: 13.2 % (ref 11.5–15.5)
WBC: 9.8 10*3/uL (ref 4.0–10.5)
nRBC: 0 % (ref 0.0–0.2)

## 2022-11-28 LAB — D-DIMER, QUANTITATIVE: D-Dimer, Quant: 0.44 ug{FEU}/mL (ref 0.00–0.50)

## 2022-11-28 LAB — URINALYSIS, ROUTINE W REFLEX MICROSCOPIC
Bilirubin Urine: NEGATIVE
Glucose, UA: NEGATIVE mg/dL
Hgb urine dipstick: NEGATIVE
Ketones, ur: NEGATIVE mg/dL
Leukocytes,Ua: NEGATIVE
Nitrite: NEGATIVE
Protein, ur: NEGATIVE mg/dL
Specific Gravity, Urine: 1.003 — ABNORMAL LOW (ref 1.005–1.030)
pH: 5 (ref 5.0–8.0)

## 2022-11-28 LAB — BASIC METABOLIC PANEL
Anion gap: 18 — ABNORMAL HIGH (ref 5–15)
BUN: 10 mg/dL (ref 6–20)
CO2: 20 mmol/L — ABNORMAL LOW (ref 22–32)
Calcium: 9.6 mg/dL (ref 8.9–10.3)
Chloride: 102 mmol/L (ref 98–111)
Creatinine, Ser: 1.06 mg/dL (ref 0.61–1.24)
GFR, Estimated: >60 mL/min (ref >60)
Glucose, Bld: 166 mg/dL — ABNORMAL HIGH (ref 70–99)
Potassium: 3.2 mmol/L — ABNORMAL LOW (ref 3.5–5.1)
Sodium: 140 mmol/L (ref 135–145)

## 2022-11-28 LAB — RAPID URINE DRUG SCREEN, HOSP PERFORMED
Amphetamines: NOT DETECTED
Barbiturates: NOT DETECTED
Benzodiazepines: NOT DETECTED
Cocaine: NOT DETECTED
Opiates: NOT DETECTED
Tetrahydrocannabinol: NOT DETECTED

## 2022-11-28 LAB — TROPONIN I (HIGH SENSITIVITY)
Troponin I (High Sensitivity): 9 ng/L (ref <18)
Troponin I (High Sensitivity): 8 ng/L (ref <18)

## 2022-11-28 MED ORDER — SODIUM CHLORIDE 0.9 % IV BOLUS
500.0000 mL | Freq: Once | INTRAVENOUS | Status: AC
  Administered 2022-11-28: 500 mL via INTRAVENOUS

## 2022-11-28 MED ORDER — HYDROXYZINE HCL 25 MG PO TABS
25.0000 mg | ORAL_TABLET | Freq: Four times a day (QID) | ORAL | 0 refills | Status: AC
Start: 2022-11-28 — End: 2022-12-13

## 2022-11-28 NOTE — Discharge Instructions (Signed)
Lab workup and imaging are reassuring, possible this could mean some anxiety, have given you a medication called Vistaril please take as prescribed.  Follow-up with community health and wellness for further assessment.  Come back to the emergency department if you develop chest pain, shortness of breath, severe abdominal pain, uncontrolled nausea, vomiting, diarrhea.
